# Patient Record
Sex: Male | Born: 1945 | Race: White | Hispanic: No | Marital: Single | Smoking: Never smoker
Health system: Southern US, Community
[De-identification: ages and names within clinical notes are randomized; demographics above are authoritative.]

## PROBLEM LIST (undated history)

## (undated) DIAGNOSIS — I509 Heart failure, unspecified: Secondary | ICD-10-CM

## (undated) DIAGNOSIS — I10 Essential (primary) hypertension: Secondary | ICD-10-CM

## (undated) HISTORY — DX: Heart failure, unspecified: I50.9

## (undated) HISTORY — DX: Essential (primary) hypertension: I10

---

## 2017-07-25 DIAGNOSIS — H35423 Microcystoid degeneration of retina, bilateral: Secondary | ICD-10-CM | POA: Diagnosis not present

## 2017-07-25 DIAGNOSIS — H524 Presbyopia: Secondary | ICD-10-CM | POA: Diagnosis not present

## 2020-06-11 ENCOUNTER — Other Ambulatory Visit: Payer: Self-pay

## 2020-06-11 ENCOUNTER — Inpatient Hospital Stay
Admission: EM | Admit: 2020-06-11 | Discharge: 2020-06-13 | DRG: 291 | Disposition: A | Discharge status: Home / Self Care | Payer: Medicare Other | Attending: Obstetrics and Gynecology | Admitting: Obstetrics and Gynecology

## 2020-06-11 ENCOUNTER — Emergency Department: Payer: Medicare Other

## 2020-06-11 ENCOUNTER — Other Ambulatory Visit
Admission: RE | Admit: 2020-06-11 | Discharge: 2020-06-11 | Disposition: A | Discharge status: Home / Self Care | Payer: Medicare Other | Source: Ambulatory Visit | Attending: Family Medicine | Admitting: Family Medicine

## 2020-06-11 ENCOUNTER — Encounter: Payer: Self-pay | Admitting: Emergency Medicine

## 2020-06-11 DIAGNOSIS — I493 Ventricular premature depolarization: Secondary | ICD-10-CM | POA: Diagnosis not present

## 2020-06-11 DIAGNOSIS — R5383 Other fatigue: Secondary | ICD-10-CM | POA: Diagnosis not present

## 2020-06-11 DIAGNOSIS — Z8249 Family history of ischemic heart disease and other diseases of the circulatory system: Secondary | ICD-10-CM

## 2020-06-11 DIAGNOSIS — I160 Hypertensive urgency: Secondary | ICD-10-CM | POA: Diagnosis not present

## 2020-06-11 DIAGNOSIS — H9190 Unspecified hearing loss, unspecified ear: Secondary | ICD-10-CM | POA: Diagnosis not present

## 2020-06-11 DIAGNOSIS — Z13220 Encounter for screening for lipoid disorders: Secondary | ICD-10-CM | POA: Diagnosis not present

## 2020-06-11 DIAGNOSIS — E669 Obesity, unspecified: Secondary | ICD-10-CM | POA: Diagnosis present

## 2020-06-11 DIAGNOSIS — Z03818 Encounter for observation for suspected exposure to other biological agents ruled out: Secondary | ICD-10-CM | POA: Diagnosis not present

## 2020-06-11 DIAGNOSIS — R0600 Dyspnea, unspecified: Secondary | ICD-10-CM | POA: Diagnosis not present

## 2020-06-11 DIAGNOSIS — I509 Heart failure, unspecified: Secondary | ICD-10-CM

## 2020-06-11 DIAGNOSIS — I5021 Acute systolic (congestive) heart failure: Secondary | ICD-10-CM

## 2020-06-11 DIAGNOSIS — R1084 Generalized abdominal pain: Secondary | ICD-10-CM | POA: Diagnosis not present

## 2020-06-11 DIAGNOSIS — Z6832 Body mass index (BMI) 32.0-32.9, adult: Secondary | ICD-10-CM | POA: Diagnosis not present

## 2020-06-11 DIAGNOSIS — M7989 Other specified soft tissue disorders: Secondary | ICD-10-CM | POA: Insufficient documentation

## 2020-06-11 DIAGNOSIS — R109 Unspecified abdominal pain: Secondary | ICD-10-CM | POA: Diagnosis not present

## 2020-06-11 DIAGNOSIS — J811 Chronic pulmonary edema: Secondary | ICD-10-CM | POA: Diagnosis not present

## 2020-06-11 DIAGNOSIS — R778 Other specified abnormalities of plasma proteins: Secondary | ICD-10-CM | POA: Diagnosis not present

## 2020-06-11 DIAGNOSIS — E038 Other specified hypothyroidism: Secondary | ICD-10-CM | POA: Diagnosis present

## 2020-06-11 DIAGNOSIS — Z23 Encounter for immunization: Secondary | ICD-10-CM | POA: Diagnosis not present

## 2020-06-11 DIAGNOSIS — I700 Atherosclerosis of aorta: Secondary | ICD-10-CM | POA: Diagnosis present

## 2020-06-11 DIAGNOSIS — I110 Hypertensive heart disease with heart failure: Secondary | ICD-10-CM | POA: Diagnosis not present

## 2020-06-11 DIAGNOSIS — I248 Other forms of acute ischemic heart disease: Secondary | ICD-10-CM | POA: Diagnosis present

## 2020-06-11 DIAGNOSIS — I5041 Acute combined systolic (congestive) and diastolic (congestive) heart failure: Secondary | ICD-10-CM | POA: Diagnosis not present

## 2020-06-11 DIAGNOSIS — Z20822 Contact with and (suspected) exposure to covid-19: Secondary | ICD-10-CM | POA: Diagnosis not present

## 2020-06-11 DIAGNOSIS — I499 Cardiac arrhythmia, unspecified: Secondary | ICD-10-CM | POA: Diagnosis not present

## 2020-06-11 DIAGNOSIS — I5031 Acute diastolic (congestive) heart failure: Secondary | ICD-10-CM

## 2020-06-11 DIAGNOSIS — Z974 Presence of external hearing-aid: Secondary | ICD-10-CM | POA: Diagnosis not present

## 2020-06-11 DIAGNOSIS — N179 Acute kidney failure, unspecified: Secondary | ICD-10-CM

## 2020-06-11 DIAGNOSIS — E8770 Fluid overload, unspecified: Secondary | ICD-10-CM | POA: Diagnosis not present

## 2020-06-11 DIAGNOSIS — I10 Essential (primary) hypertension: Secondary | ICD-10-CM

## 2020-06-11 DIAGNOSIS — R350 Frequency of micturition: Secondary | ICD-10-CM | POA: Diagnosis not present

## 2020-06-11 DIAGNOSIS — R030 Elevated blood-pressure reading, without diagnosis of hypertension: Secondary | ICD-10-CM | POA: Diagnosis not present

## 2020-06-11 DIAGNOSIS — I517 Cardiomegaly: Secondary | ICD-10-CM | POA: Diagnosis not present

## 2020-06-11 DIAGNOSIS — K219 Gastro-esophageal reflux disease without esophagitis: Secondary | ICD-10-CM | POA: Diagnosis not present

## 2020-06-11 DIAGNOSIS — R0602 Shortness of breath: Secondary | ICD-10-CM | POA: Diagnosis not present

## 2020-06-11 LAB — BASIC METABOLIC PANEL
Anion gap: 12 (ref 5–15)
BUN: 24 mg/dL — ABNORMAL HIGH (ref 8–23)
CO2: 25 mmol/L (ref 22–32)
Calcium: 9.4 mg/dL (ref 8.9–10.3)
Chloride: 107 mmol/L (ref 98–111)
Creatinine, Ser: 1.54 mg/dL — ABNORMAL HIGH (ref 0.61–1.24)
GFR, Estimated: 47 mL/min — ABNORMAL LOW (ref >60)
Glucose, Bld: 126 mg/dL — ABNORMAL HIGH (ref 70–99)
Potassium: 3.8 mmol/L (ref 3.5–5.1)
Sodium: 144 mmol/L (ref 135–145)

## 2020-06-11 LAB — BRAIN NATRIURETIC PEPTIDE: B Natriuretic Peptide: 1833.3 pg/mL — ABNORMAL HIGH (ref 0.0–100.0)

## 2020-06-11 LAB — HEPATIC FUNCTION PANEL
ALT: 34 U/L (ref 0–44)
AST: 32 U/L (ref 15–41)
Albumin: 4.4 g/dL (ref 3.5–5.0)
Alkaline Phosphatase: 72 U/L (ref 38–126)
Bilirubin, Direct: 0.3 mg/dL — ABNORMAL HIGH (ref 0.0–0.2)
Indirect Bilirubin: 1.4 mg/dL — ABNORMAL HIGH (ref 0.3–0.9)
Total Bilirubin: 1.7 mg/dL — ABNORMAL HIGH (ref 0.3–1.2)
Total Protein: 7.4 g/dL (ref 6.5–8.1)

## 2020-06-11 LAB — CBC
HCT: 47.3 % (ref 39.0–52.0)
Hemoglobin: 15.9 g/dL (ref 13.0–17.0)
MCH: 31.4 pg (ref 26.0–34.0)
MCHC: 33.6 g/dL (ref 30.0–36.0)
MCV: 93.5 fL (ref 80.0–100.0)
Platelets: 181 10*3/uL (ref 150–400)
RBC: 5.06 MIL/uL (ref 4.22–5.81)
RDW: 14 % (ref 11.5–15.5)
WBC: 8.8 10*3/uL (ref 4.0–10.5)
nRBC: 0 % (ref 0.0–0.2)

## 2020-06-11 LAB — TROPONIN I (HIGH SENSITIVITY)
Troponin I (High Sensitivity): 91 ng/L — ABNORMAL HIGH (ref <18)
Troponin I (High Sensitivity): 78 ng/L — ABNORMAL HIGH (ref <18)

## 2020-06-11 LAB — FIBRIN DERIVATIVES D-DIMER (ARMC ONLY): Fibrin derivatives D-dimer (ARMC): 808.76 ng/mL (FEU) — ABNORMAL HIGH (ref 0.00–499.00)

## 2020-06-11 LAB — SARS CORONAVIRUS 2 BY RT PCR (HOSPITAL ORDER, PERFORMED IN ~~LOC~~ HOSPITAL LAB): SARS Coronavirus 2: NEGATIVE

## 2020-06-11 MED ORDER — ONDANSETRON HCL 4 MG/2ML IJ SOLN: 4.0000 mg | Freq: Four times a day (QID) | INTRAMUSCULAR | Status: DC | PRN

## 2020-06-11 MED ORDER — ONDANSETRON HCL 4 MG PO TABS: 4.0000 mg | ORAL_TABLET | Freq: Four times a day (QID) | ORAL | Status: DC | PRN

## 2020-06-11 MED ORDER — FUROSEMIDE 10 MG/ML IJ SOLN
60.0000 mg | Freq: Once | INTRAMUSCULAR | Status: AC
  Administered 2020-06-11: 60 mg via INTRAVENOUS
  Filled 2020-06-11: qty 8

## 2020-06-11 MED ORDER — ACETAMINOPHEN 325 MG PO TABS: 650.0000 mg | ORAL_TABLET | Freq: Four times a day (QID) | ORAL | Status: DC | PRN

## 2020-06-11 MED ORDER — ACETAMINOPHEN 650 MG RE SUPP: 650.0000 mg | Freq: Four times a day (QID) | RECTAL | Status: DC | PRN

## 2020-06-11 MED ORDER — LABETALOL HCL 5 MG/ML IV SOLN
10.0000 mg | Freq: Once | INTRAVENOUS | Status: AC
  Administered 2020-06-11: 10 mg via INTRAVENOUS
  Filled 2020-06-11: qty 4

## 2020-06-11 MED ORDER — IOHEXOL 350 MG/ML SOLN
75.0000 mL | Freq: Once | INTRAVENOUS | Status: AC | PRN
  Administered 2020-06-11: 75 mL via INTRAVENOUS

## 2020-06-11 MED ORDER — FUROSEMIDE 10 MG/ML IJ SOLN
40.0000 mg | Freq: Two times a day (BID) | INTRAMUSCULAR | Status: DC
  Administered 2020-06-12: 40 mg via INTRAVENOUS
  Filled 2020-06-11: qty 4

## 2020-06-11 MED ORDER — ENOXAPARIN SODIUM 40 MG/0.4ML ~~LOC~~ SOLN
40.0000 mg | SUBCUTANEOUS | Status: DC
  Administered 2020-06-11 – 2020-06-12 (×2): 40 mg via SUBCUTANEOUS
  Filled 2020-06-11 (×2): qty 0.4

## 2020-06-11 MED ORDER — TRAZODONE HCL 50 MG PO TABS: 25.0000 mg | ORAL_TABLET | Freq: Every evening | ORAL | Status: DC | PRN

## 2020-06-11 MED ORDER — MAGNESIUM HYDROXIDE 400 MG/5ML PO SUSP: 30.0000 mL | Freq: Every day | ORAL | Status: DC | PRN

## 2020-06-11 NOTE — ED Notes (Signed)
Pt connected to cardiac monitor. NAD noted at this time.

## 2020-06-11 NOTE — ED Triage Notes (Signed)
Pt comes into the ED via POV from [facility] clinic where the patient had abnormal labs with a BNP over 1800 and a d-dimer over 800.  Pt denies any CP or dizziness but states every once in a while he has some SHOB.  Pt has even and unlabored respirations at this time and is in NAD. Pt states he does have pain in the left leg and it gets worse when he walks.  Pt denies any excess swelling at this time.

## 2020-06-11 NOTE — ED Provider Notes (Signed)
[facility] Emergency Department Provider Note  Time seen: 5:23 PM  I have reviewed the triage vital signs and the nursing notes.   HISTORY  Chief Complaint Abnormal Lab   HPI Douglas Mccall is a 76 y.o. male with no significant past medical history but does not follow-up with a primary care physician, presents to the emergency department for abnormal lab work.  According to the patient for the past week or 2 he has been experiencing what he describes as possible panic attacks.  States at night especially when he is lying on his side he will have a fluttering sensation sometimes in his abdomen or chest and feels somewhat short of breath.  Denies any chest pain at any point.  Does have mild swelling in his legs but denies any significant edema.  Denies any orthopnea.   Denies any fever or cough.  Patient was seen at the walk-in clinic today had lab work performed showing an elevated BNP of 1800 as well as an elevated D-dimer of 800 and was sent to the emergency department for further evaluation.  Patient is hypertensive currently 165/117.   History reviewed. No pertinent past medical history.  There are no problems to display for this patient.   History reviewed. No pertinent surgical history.  Prior to Admission medications   Not on File    No Known Allergies  History reviewed. No pertinent family history.  Social History Social History   Tobacco Use  . Smoking status: Never Smoker  . Smokeless tobacco: Never Used    Review of Systems Constitutional: Negative for fever. Cardiovascular: Negative for chest pain. Respiratory: Shortness of breath at times especially when lying down at night.  But does not describe clear orthopnea. Gastrointestinal: Negative for abdominal pain, vomiting and diarrhea. Musculoskeletal: Minimal leg swelling bilaterally Skin: Negative for skin complaints  Neurological: Negative for headache All other ROS  negative  ____________________________________________   PHYSICAL EXAM:  VITAL SIGNS: ED Triage Vitals  Enc Vitals Group     BP 06/11/20 1645 (!) 165/117     Pulse Rate 06/11/20 1645 93     Resp 06/11/20 1645 20     Temp 06/11/20 1645 98.1 F (36.7 C)     Temp Source 06/11/20 1645 Oral     SpO2 06/11/20 1645 94 %     Weight 06/11/20 1643 262 lb (118.8 kg)     Height 06/11/20 1643 6\' 1"  (1.854 m)     Head Circumference --      Peak Flow --      Pain Score 06/11/20 1643 0     Pain Loc --      Pain Edu? --      Excl. in GC? --    Constitutional: Alert and oriented. Well appearing and in no distress. Eyes: Normal exam ENT      Head: Normocephalic and atraumatic.      Mouth/Throat: Mucous membranes are moist. Cardiovascular: Normal rate, regular rhythm. Respiratory: Normal respiratory effort without tachypnea nor retractions. Breath sounds are clear Gastrointestinal: Soft and nontender. No distention. Musculoskeletal: Nontender with normal range of motion in all extremities.  Mild pedal edema bilaterally.  Calves are nontender. Neurologic:  Normal speech and language. No gross focal neurologic deficits Skin:  Skin is warm, dry and intact.  Psychiatric: Mood and affect are normal.   ____________________________________________    EKG  EKG viewed and interpreted by myself shows a normal sinus rhythm at 94 bpm with a borderline widened QRS,  normal axis, slight QTC prolongation otherwise normal intervals nonspecific ST changes but no ST elevation.  ____________________________________________    RADIOLOGY  Chest x-ray shows cardiac enlargement with vascular congestion. CT shows no PE but there is groundglass opacities consistent with atelectasis versus infectious etiology.  ____________________________________________   INITIAL IMPRESSION / ASSESSMENT AND PLAN / ED COURSE  Pertinent labs & imaging results that were available during my care of the patient were  reviewed by me and considered in my medical decision making (see chart for details).   Patient presents to the emergency department for intermittent episodes of shortness of breath, fluttering at times, abnormal lab work.  Given the patient's elevated D-dimer we will proceed with a CTA of the chest as well as a Covid test.  Patient does have mild pedal edema as well as an elevated BNP suspect a degree of CHF, possibly related to the patient's significant hypertension 165/117 which is currently untreated.  We will check labs including cardiac enzymes, CTA, continue to closely monitor while awaiting results.  Patient's labs are resulted showing elevated troponin of 91 as well as an elevated BNP performed as an outpatient of 1800 with imaging consistent with new onset CHF possibly hypertension dependent.  We will dose labetalol, IV Lasix and admit to the hospitalist service.  Covid test is pending.  Patient agreeable to plan.  Douglas Mccall was evaluated in Emergency Department on 06/11/2020 for the symptoms described in the history of present illness. He was evaluated in the context of the global COVID-19 pandemic, which necessitated consideration that the patient might be at risk for infection with the SARS-CoV-2 virus that causes COVID-19. Institutional protocols and algorithms that pertain to the evaluation of patients at risk for COVID-19 are in a state of rapid change based on information released by regulatory bodies including the CDC and federal and state organizations. These policies and algorithms were followed during the patient's care in the ED.  ____________________________________________   FINAL CLINICAL IMPRESSION(S) / ED DIAGNOSES  New onset CHF Hypertension Elevated troponin   Minna Antis, MD 06/11/20 1910

## 2020-06-11 NOTE — ED Triage Notes (Signed)
Pt to ED via POV, states was seen at [facility] earlier today and was called and told to come back to ED. Upon review of chart pt's BNP noted to be 1833 and D-Dimer 808.

## 2020-06-11 NOTE — H&P (Signed)
[facility]   PATIENT NAME: Douglas Mccall    MR#:  [id number]  DATE OF BIRTH:  [birth year]  DATE OF ADMISSION:  06/11/2020  PRIMARY CARE PHYSICIAN: Patient, No Pcp Per   REQUESTING/REFERRING PHYSICIAN: Minna Antis, MD  CHIEF COMPLAINT:   Chief Complaint  Patient presents with  . Abnormal Lab    HISTORY OF PRESENT ILLNESS:  Douglas Mccall  is a 75 y.o. Caucasian male with no known medical problems as he has not seen a physician since 1995, who presented to the emergency room with acute onset of recent dyspnea with associated orthopnea, paroxysmal nocturnal dyspnea and worsening lower extremity edema with dyspnea on exertion.  He admitted to decrease urinary stream.  He denies any fever or chills.  No cough or wheezing.  No chest pain or palpitations.  No dysuria or hematuria or flank pain.  He has been vaccinated and boosted for COVID-19.  Upon presentation to the ER, blood pressure was 165/1 6, blood pressure was 165/117 with otherwise normal vital signs.  Labs revealed a BUN of 20 4R and creatinine 1.54 with a direct bili of 1.4 and total bili 1.7 and CBC was unremarkable.  High-sensitivity troponin I was 91 and later 78 COVID-19 PCR came back negative.  Two-view chest x-ray showed cardiomegaly with pulmonary vascular congestion.  Chest CTA revealed no acute PE.  It showed mild bilateral groundglass densities that could be related to atelectasis or infectious etiology.  It showed aortic atherosclerosis. EKG showed sinus rhythm with rate of 94 with occasional PVCs and biventricular hypertrophy with prolonged QT interval with QTC 515 MS.  The patient was given 60 mg of IV Lasix and 10 mg of IV labetalol.  He will be admitted to a progressive unit bed for further evaluation and management. PAST MEDICAL HISTORY:  No known chronic medical problems  PAST SURGICAL HISTORY:  Tonsillectomy  SOCIAL HISTORY:   Social History   Tobacco Use  . Smoking status: Never Smoker  .  Smokeless tobacco: Never Used  Substance Use Topics  . Alcohol use: Not on file    FAMILY HISTORY:   Positive hypertension and coronary artery disease.  DRUG ALLERGIES:  No Known Allergies  REVIEW OF SYSTEMS:   ROS As per history of present illness. All pertinent systems were reviewed above. Constitutional, HEENT, cardiovascular, respiratory, GI, GU, musculoskeletal, neuro, psychiatric, endocrine, integumentary and hematologic systems were reviewed and are otherwise negative/unremarkable except for positive findings mentioned above in the HPI.   MEDICATIONS AT HOME:   Prior to Admission medications   Not on File      VITAL SIGNS:  Blood pressure (!) 177/117, pulse 91, temperature 98.1 F (36.7 C), temperature source Oral, resp. rate 20, height 6\' 1"  (1.854 m), weight 118.8 kg, SpO2 96 %.  PHYSICAL EXAMINATION:  Physical Exam  GENERAL:  75 y.o.-year-old obese Caucasian patient lying in the bed with mild respiratory distress with conversational dyspnea. EYES: Pupils equal, round, reactive to light and accommodation. No scleral icterus. Extraocular muscles intact.  HEENT: Head atraumatic, normocephalic. Oropharynx and nasopharynx clear.  NECK:  Supple, no jugular venous distention. No thyroid enlargement, no tenderness.  LUNGS: Diminished bibasal breath sounds with bibasal rales.   CARDIOVASCULAR: Regular rate and rhythm, S1, S2 normal. No murmurs, rubs, or gallops.  ABDOMEN: Soft, nondistended, nontender. Bowel sounds present. No organomegaly or mass.  EXTREMITIES: 1-2+ bilateral lower extremity pitting pedal edema, with no cyanosis, or clubbing.  NEUROLOGIC: Cranial nerves II through XII are  intact. Muscle strength 5/5 in all extremities. Sensation intact. Gait not checked.  PSYCHIATRIC: The patient is alert and oriented x 3.  Normal affect and good eye contact. SKIN: No obvious rash, lesion, or ulcer.   LABORATORY PANEL:   CBC Recent Labs  Lab 06/11/20 1647  WBC 8.8   HGB 15.9  HCT 47.3  PLT 181   ------------------------------------------------------------------------------------------------------------------  Chemistries  Recent Labs  Lab 06/11/20 1647  NA 144  K 3.8  CL 107  CO2 25  GLUCOSE 126*  BUN 24*  CREATININE 1.54*  CALCIUM 9.4  AST 32  ALT 34  ALKPHOS 72  BILITOT 1.7*   ------------------------------------------------------------------------------------------------------------------  Cardiac Enzymes No results for input(s): TROPONINI in the last 168 hours. ------------------------------------------------------------------------------------------------------------------  RADIOLOGY:  DG Chest 2 View  Result Date: 06/11/2020 CLINICAL DATA:  Shortness of breath.  Abnormal labs. EXAM: CHEST - 2 VIEW COMPARISON:  06/11/2020 FINDINGS: Mild cardiac enlargement. No pleural effusion or edema identified. Diffuse pulmonary vascular congestion. No airspace densities. Visualized osseous structures are notable for multilevel thoracic spondylosis. IMPRESSION: Cardiac enlargement and pulmonary vascular congestion. Electronically Signed   By: Signa Kell M.D.   On: 06/11/2020 17:54   CT Angio Chest PE W and/or Wo Contrast  Result Date: 06/11/2020 CLINICAL DATA:  Elevated D-dimer and shortness of breath EXAM: CT ANGIOGRAPHY CHEST WITH CONTRAST TECHNIQUE: Multidetector CT imaging of the chest was performed using the standard protocol during bolus administration of intravenous contrast. Multiplanar CT image reconstructions and MIPs were obtained to evaluate the vascular anatomy. CONTRAST:  84mL OMNIPAQUE IOHEXOL 350 MG/ML SOLN COMPARISON:  None. FINDINGS: Cardiovascular: There is a optimal opacification of the pulmonary arteries. There is no central,segmental, or subsegmental filling defects within the pulmonary arteries. There is moderate cardiomegaly present. No pericardial effusion or thickening. No evidence right heart strain. There is normal  three-vessel brachiocephalic anatomy without proximal stenosis. Scattered aortic atherosclerosis is seen. Mediastinum/Nodes: No hilar, mediastinal, or axillary adenopathy. Thyroid gland, trachea, and esophagus demonstrate no significant findings. Lungs/Pleura: Minimal ground-glass opacities are seen throughout both lungs. No pleural effusion or pneumothorax is seen. Upper Abdomen: No acute abnormalities present in the visualized portions of the upper abdomen. Musculoskeletal: No chest wall abnormality. No acute or significant osseous findings. Review of the MIP images confirms the above findings. IMPRESSION: No central, segmental, or subsegmental pulmonary embolism Minimal bilateral ground-glass opacities which could be due to atelectasis or infectious etiology. Aortic Atherosclerosis (ICD10-I70.0). Electronically Signed   By: Jonna Clark M.D.   On: 06/11/2020 18:40      IMPRESSION AND PLAN:   1.  Acute new onset CHF likely diastolic. The patient will be admitted to progressive unit bed. -We will diurese with IV Lasix. -We will follow serial troponin I's. -We will obtain a cardiology consultation and 2D echo. -I notified Dr. Welton Flakes about the patient.  2.  Hypertensive urgency.  This is likely the culprit for #1. -The patient will be placed on as needed IV labetalol. -We will start him on Norvasc.  3.  Acute kidney injury. -This could be prerenal due to acute CHF. -We will follow BMP with diuresis.  4.  Elevated troponin I. -This likely secondary to demand ischemia. -Troponin I has been trending down. -2D echo and a cardiology consult to be obtained as mentioned above.  5.  DVT prophylaxis. -Subcutaneous Lovenox.   All the records are reviewed and case discussed with ED provider. The plan of care was discussed in details with the patient (and family). I answered  all questions. The patient agreed to proceed with the above mentioned plan. Further management will depend upon hospital  course.   CODE STATUS: Full code  Status is: Inpatient  Remains inpatient appropriate because:Ongoing diagnostic testing needed not appropriate for outpatient work up, Unsafe d/c plan, IV treatments appropriate due to intensity of illness or inability to take PO and Inpatient level of care appropriate due to severity of illness   Dispo: The patient is from: Home              Anticipated d/c is to: Home              Anticipated d/c date is: 3 days              Patient currently is not medically stable to d/c.   Difficult to place patient No      TOTAL TIME TAKING CARE OF THIS PATIENT: 55 minutes.    Hannah Beat M.D on 06/11/2020 at 7:19 PM  [facility]   From 7 PM-7 AM, contact night-coverage [email or web address]  CC: Primary care physician; Patient, No Pcp Per

## 2020-06-11 NOTE — ED Notes (Signed)
Pt presents to ED with c/o of having a "shock wave" go through his body that has been happening for 3-4 weeks. Pt states this usually happens when he is laying down and pt also has c/o of SOB when this feeling happens. Pt states L foot has been "numb at times" and is more swollen than the R. L pedal pulse intact. Pt states he still rides his bicycle miles at a time for exercise. Pt is A&Ox4. Pt states he was sent from [facility] clinic due to abnormal labs. Pt was sent due to increased BNP and D-Dimer. Pt denies chest pain or any pain at this time.

## 2020-06-12 ENCOUNTER — Encounter: Payer: Self-pay | Admitting: Family Medicine

## 2020-06-12 DIAGNOSIS — I509 Heart failure, unspecified: Secondary | ICD-10-CM

## 2020-06-12 LAB — LIPID PANEL
Cholesterol: 172 mg/dL (ref 0–200)
HDL: 44 mg/dL (ref >40)
LDL Cholesterol: 116 mg/dL — ABNORMAL HIGH (ref 0–99)
Total CHOL/HDL Ratio: 3.9 RATIO
Triglycerides: 62 mg/dL (ref <150)
VLDL: 12 mg/dL (ref 0–40)

## 2020-06-12 LAB — HEPATIC FUNCTION PANEL
ALT: 31 U/L (ref 0–44)
AST: 31 U/L (ref 15–41)
Albumin: 4.2 g/dL (ref 3.5–5.0)
Alkaline Phosphatase: 69 U/L (ref 38–126)
Bilirubin, Direct: 0.3 mg/dL — ABNORMAL HIGH (ref 0.0–0.2)
Indirect Bilirubin: 1.6 mg/dL — ABNORMAL HIGH (ref 0.3–0.9)
Total Bilirubin: 1.9 mg/dL — ABNORMAL HIGH (ref 0.3–1.2)
Total Protein: 7 g/dL (ref 6.5–8.1)

## 2020-06-12 LAB — HIV ANTIBODY (ROUTINE TESTING W REFLEX): HIV Screen 4th Generation wRfx: NONREACTIVE

## 2020-06-12 LAB — CBC
HCT: 45.2 % (ref 39.0–52.0)
Hemoglobin: 15.4 g/dL (ref 13.0–17.0)
MCH: 31.6 pg (ref 26.0–34.0)
MCHC: 34.1 g/dL (ref 30.0–36.0)
MCV: 92.6 fL (ref 80.0–100.0)
Platelets: 160 10*3/uL (ref 150–400)
RBC: 4.88 MIL/uL (ref 4.22–5.81)
RDW: 13.9 % (ref 11.5–15.5)
WBC: 7.4 10*3/uL (ref 4.0–10.5)
nRBC: 0 % (ref 0.0–0.2)

## 2020-06-12 LAB — BASIC METABOLIC PANEL
Anion gap: 12 (ref 5–15)
BUN: 27 mg/dL — ABNORMAL HIGH (ref 8–23)
CO2: 26 mmol/L (ref 22–32)
Calcium: 9.1 mg/dL (ref 8.9–10.3)
Chloride: 104 mmol/L (ref 98–111)
Creatinine, Ser: 1.35 mg/dL — ABNORMAL HIGH (ref 0.61–1.24)
GFR, Estimated: 55 mL/min — ABNORMAL LOW (ref >60)
Glucose, Bld: 89 mg/dL (ref 70–99)
Potassium: 3.4 mmol/L — ABNORMAL LOW (ref 3.5–5.1)
Sodium: 142 mmol/L (ref 135–145)

## 2020-06-12 LAB — T4, FREE: Free T4: 0.79 ng/dL (ref 0.61–1.12)

## 2020-06-12 LAB — TSH: TSH: 12.434 u[IU]/mL — ABNORMAL HIGH (ref 0.350–4.500)

## 2020-06-12 LAB — HEMOGLOBIN A1C
Hgb A1c MFr Bld: 5.3 % (ref 4.8–5.6)
Mean Plasma Glucose: 105.41 mg/dL

## 2020-06-12 MED ORDER — FUROSEMIDE 10 MG/ML IJ SOLN
40.0000 mg | Freq: Two times a day (BID) | INTRAMUSCULAR | Status: AC
  Administered 2020-06-12: 40 mg via INTRAVENOUS
  Filled 2020-06-12: qty 4

## 2020-06-12 MED ORDER — FUROSEMIDE 20 MG PO TABS
20.0000 mg | ORAL_TABLET | Freq: Every day | ORAL | Status: DC
  Administered 2020-06-13: 20 mg via ORAL
  Filled 2020-06-12: qty 1

## 2020-06-12 NOTE — Plan of Care (Signed)
  Problem: Clinical Measurements: Goal: Will remain free from infection Outcome: Progressing   Problem: Clinical Measurements: Goal: Diagnostic test results will improve Outcome: Progressing   Problem: Clinical Measurements: Goal: Ability to maintain clinical measurements within normal limits will improve Outcome: Progressing   Problem: Health Behavior/Discharge Planning: Goal: Ability to manage health-related needs will improve Outcome: Progressing   Problem: Education: Goal: Knowledge of General Education information will improve Description: Including pain rating scale, medication(s)/side effects and non-pharmacologic comfort measures Outcome: Progressing

## 2020-06-12 NOTE — Consult Note (Addendum)
   Heart Failure Nurse Navigator Note  HF  Echocardiogram is pending at this time.   He presented to the emergency room with worsening PND and orthopnea.  Chest x-ray revealed cardiomegaly with pulmonary vascular congestion.  Blood pressure on admission was 177/117.  Comorbidities:  Hypertension   Medications:  Furosemide 40 mg IV every 12 hours   Labs:  Sodium 142, potassium 3.4, chloride 104, CO2 26, BUN 27 creatinine 1.35, GFR 55, total cholesterol 172, triglycerides 62, HDL 44, LDL 116, hemoglobin A1c 5.3, TSH 12.434, free T4 is pending Weight is 112 kg (weight is down 6.8 kg since admission) BMI is 32.59 Blood pressure 143/89 Intake 240 mL Output 2900 mL   Assessment:  General-he is awake and alert sitting up in the chair at bedside no acute distress.  HEENT-edentulous, no JVD.  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  Cardiac-heart tones of regular rate and rhythm.  Chest-breath sounds are clear to posterior auscultation.  Abdomen-soft nontender  Musculoskeletal-lower extremities there is no edema noted  Neurologic-he is awake and alert speech is clear.  Moves all extremities without difficulty.  Psych is pleasant and appropriate makes good eye contact.   Initial visit with patient.  States he has not been seen by a physician since 1995.  He states that he is fairly active and rides his bike at least 3 times a week.  He states the reason he had not seen a physician for so long was that he felt well, has nothing against doctors but his family just did not believe in going to the doctor if you felt well.  Discussed types of  heart failure with the patient. Instructed once echo is complete, will know more about his heart failure.  Began heart failure teaching with the patient.  Discussed the importance of weighing daily and recording, again with a low sodium diet and fluid restriction.  He viewed the heart failure videos. By teach back methold discussed  the video and what he had learned, he had caught that he should not be taking Advil.  Also given the heart failure teaching booklet along with his zone magnet, an appointment and information about the outpatient heart failure clinic.  He states that he does not have a scale at home to weigh himself.  Also requesting blood pressure cuff to monitor blood pressures at home. Going to contact [facility], to supply him with one.  Reds Vest  Clip reading was 28%.   Tresa Endo RN [facility]

## 2020-06-12 NOTE — Progress Notes (Signed)
Pt admitted from ED came from home with New onset CHF exacerbation with dyspnea on lying down which pt stated has significantly improved afte giving lasix IV. Pt awake alert and oriented x 4 mae no c/o of pain  Pt denied SOB at present.. Care plan and CHF education discussed with  Pt . And pt verbalized good understanding. . Cardiac monitor in use HR in 80 s SR with BBB . Oriented to room and use of the call light to call for assist when ever needed.  Vs stable. RN will continue to monitor pt.

## 2020-06-12 NOTE — Progress Notes (Addendum)
PROGRESS NOTE    Douglas Mccall  URK:[id number] DOB: 04/06/1946 DOA: 06/11/2020 PCP: Patient, No Pcp Per  Outpatient Specialists: none    Brief Narrative:   Douglas Mccall  is a 75 y.o. Caucasian male with no known medical problems as he has not seen a physician since 1995, who presented to the emergency room with acute onset of recent dyspnea with associated orthopnea, paroxysmal nocturnal dyspnea and worsening lower extremity edema with dyspnea on exertion.  He admitted to decrease urinary stream.  He denies any fever or chills.  No cough or wheezing.  No chest pain or palpitations.  No dysuria or hematuria or flank pain.  He has been vaccinated and boosted for COVID-19.  Upon presentation to the ER, blood pressure was 165/1 6, blood pressure was 165/117 with otherwise normal vital signs.  Labs revealed a BUN of 20 4R and creatinine 1.54 with a direct bili of 1.4 and total bili 1.7 and CBC was unremarkable.  High-sensitivity troponin I was 91 and later 78 COVID-19 PCR came back negative.  Two-view chest x-ray showed cardiomegaly with pulmonary vascular congestion.  Chest CTA revealed no acute PE.  It showed mild bilateral groundglass densities that could be related to atelectasis or infectious etiology.  It showed aortic atherosclerosis. EKG showed sinus rhythm with rate of 94 with occasional PVCs and biventricular hypertrophy with prolonged QT interval with QTC 515 MS.  The patient was given 60 mg of IV Lasix and 10 mg of IV labetalol.  He will be admitted to a progressive unit bed for further evaluation and management.   Assessment & Plan:   Active Problems:   Acute CHF (congestive heart failure) (HCC)  Acute new onset CHF  Demand ischemia Here w/ dyspnea, PND. CXR with vascular congestion. BNP markedly elevated. Troponins mildly elevated and flat (91>78), no CP or ischemic ekg findings. Has responded to IV lasix. Out 3 L yesterday. Does report history of rheumatic fever when young  -  cardiology consulted, recs pending.  - continue with lasix 40 iv bid for now - f/u echocardiogram - f/u a1c, tsh, lipids - possible d/c this afternoon of no concerning findings on TTE  Hypertensive urgency.  Likely 2/2 volume overload as has resolved w/ diuresis  Acute kidney injury. Vs ckd as no known baseline. Cr 1.5 on arrival, 1.35 this AM - trend   Elevated bilirubin Possibly congestive hepatopathy - repeat LFTs pending  Subclinical hypothyroidism Elevated TSH, free t4 wnl - advise outpt repeat TFTs  Health care maintenance - f/u hiv, hcv   DVT prophylaxis: lovenox Code Status: full Family Communication: none @ bedside  Level of care: Progressive Cardiac Status is: Inpatient  Remains inpatient appropriate because:Inpatient level of care appropriate due to severity of illness   Dispo: The patient is from: Home              Anticipated d/c is to: Home              Anticipated d/c date is: 1 day              Patient currently is not medically stable to d/c.   Difficult to place patient No        Consultants:  cardiology  Procedures: none  Antimicrobials:  none    Subjective: This morning PND improved, no chest pain or sob  Objective: Vitals:   06/11/20 2215 06/11/20 2331 06/12/20 0413 06/12/20 0759  BP: (!) 143/84 (!) 135/110 123/81 125/88  Pulse: 62 78  68 (!) 58  Resp: 18 19 18 18   Temp: 98.6 F (37 C)  97.8 F (36.6 C) 98 F (36.7 C)  TempSrc: Oral     SpO2: 97% 98% 94% 95%  Weight:   112 kg   Height:        Intake/Output Summary (Last 24 hours) at 06/12/2020 0905 Last data filed at 06/12/2020 0640 Gross per 24 hour  Intake 240 ml  Output 2900 ml  Net -2660 ml   Filed Weights   06/11/20 1643 06/12/20 0413  Weight: 118.8 kg 112 kg    Examination:  General exam: Appears calm and comfortable  Respiratory system: Clear to auscultation. Respiratory effort normal. Cardiovascular system: S1 & S2 heard, RRR. No JVD, murmurs, rubs,  gallops or clicks. No pedal edema. Gastrointestinal system: Abdomen is obese, nondistended, soft and nontender. No organomegaly or masses felt. Normal bowel sounds heard. Central nervous system: Alert and oriented. No focal neurological deficits. Extremities: Symmetric 5 x 5 power. 1 + LE pitting edema Skin: No rashes, lesions or ulcers Psychiatry: Judgement and insight appear normal. Mood & affect appropriate.     Data Reviewed: I have personally reviewed following labs and imaging studies  CBC: Recent Labs  Lab 06/11/20 1647 06/12/20 0418  WBC 8.8 7.4  HGB 15.9 15.4  HCT 47.3 45.2  MCV 93.5 92.6  PLT 181 160   Basic Metabolic Panel: Recent Labs  Lab 06/11/20 1647 06/12/20 0418  NA 144 142  K 3.8 3.4*  CL 107 104  CO2 25 26  GLUCOSE 126* 89  BUN 24* 27*  CREATININE 1.54* 1.35*  CALCIUM 9.4 9.1   GFR: Estimated Creatinine Clearance: 63.9 mL/min (A) (by C-G formula based on SCr of 1.35 mg/dL (H)). Liver Function Tests: Recent Labs  Lab 06/11/20 1647  AST 32  ALT 34  ALKPHOS 72  BILITOT 1.7*  PROT 7.4  ALBUMIN 4.4   No results for input(s): LIPASE, AMYLASE in the last 168 hours. No results for input(s): AMMONIA in the last 168 hours. Coagulation Profile: No results for input(s): INR, PROTIME in the last 168 hours. Cardiac Enzymes: No results for input(s): CKTOTAL, CKMB, CKMBINDEX, TROPONINI in the last 168 hours. BNP (last 3 results) No results for input(s): PROBNP in the last 8760 hours. HbA1C: No results for input(s): HGBA1C in the last 72 hours. CBG: No results for input(s): GLUCAP in the last 168 hours. Lipid Profile: No results for input(s): CHOL, HDL, LDLCALC, TRIG, CHOLHDL, LDLDIRECT in the last 72 hours. Thyroid Function Tests: No results for input(s): TSH, T4TOTAL, FREET4, T3FREE, THYROIDAB in the last 72 hours. Anemia Panel: No results for input(s): VITAMINB12, FOLATE, FERRITIN, TIBC, IRON, RETICCTPCT in the last 72 hours. Urine analysis: No  results found for: COLORURINE, APPEARANCEUR, LABSPEC, PHURINE, GLUCOSEU, HGBUR, BILIRUBINUR, KETONESUR, PROTEINUR, UROBILINOGEN, NITRITE, LEUKOCYTESUR Sepsis Labs: @LABRCNTIP (procalcitonin:4,lacticidven:4)  ) Recent Results (from the past 240 hour(s))  SARS Coronavirus 2 by RT PCR (hospital order, performed in [facility] hospital lab) Nasopharyngeal Nasopharyngeal Swab     Status: None   Collection Time: 06/11/20  6:46 PM   Specimen: Nasopharyngeal Swab  Result Value Ref Range Status   SARS Coronavirus 2 NEGATIVE NEGATIVE Final    Comment: (NOTE) SARS-CoV-2 target nucleic acids are NOT DETECTED.  The SARS-CoV-2 RNA is generally detectable in upper and lower respiratory specimens during the acute phase of infection. The lowest concentration of SARS-CoV-2 viral copies this assay can detect is 250 copies / mL. A negative result does not preclude  SARS-CoV-2 infection and should not be used as the sole basis for treatment or other patient management decisions.  A negative result may occur with improper specimen collection / handling, submission of specimen other than nasopharyngeal swab, presence of viral mutation(s) within the areas targeted by this assay, and inadequate number of viral copies (<250 copies / mL). A negative result must be combined with clinical observations, patient history, and epidemiological information.  Fact Sheet for Patients:   BoilerBrush.com.cy  Fact Sheet for Healthcare Providers: https://pope.com/  This test is not yet approved or  cleared by the [place] FDA and has been authorized for detection and/or diagnosis of SARS-CoV-2 by FDA under an Emergency Use Authorization (EUA).  This EUA will remain in effect (meaning this test can be used) for the duration of the COVID-19 declaration under Section 564(b)(1) of the Act, 21 U.S.C. section 360bbb-3(b)(1), unless the authorization is terminated or revoked  sooner.  Performed at [facility], [address], [place], [place] [address]          Radiology Studies: DG Chest 2 View  Result Date: 06/11/2020 CLINICAL DATA:  Shortness of breath.  Abnormal labs. EXAM: CHEST - 2 VIEW COMPARISON:  06/11/2020 FINDINGS: Mild cardiac enlargement. No pleural effusion or edema identified. Diffuse pulmonary vascular congestion. No airspace densities. Visualized osseous structures are notable for multilevel thoracic spondylosis. IMPRESSION: Cardiac enlargement and pulmonary vascular congestion. Electronically Signed   By: Signa Kell M.D.   On: 06/11/2020 17:54   CT Angio Chest PE W and/or Wo Contrast  Result Date: 06/11/2020 CLINICAL DATA:  Elevated D-dimer and shortness of breath EXAM: CT ANGIOGRAPHY CHEST WITH CONTRAST TECHNIQUE: Multidetector CT imaging of the chest was performed using the standard protocol during bolus administration of intravenous contrast. Multiplanar CT image reconstructions and MIPs were obtained to evaluate the vascular anatomy. CONTRAST:  87mL OMNIPAQUE IOHEXOL 350 MG/ML SOLN COMPARISON:  None. FINDINGS: Cardiovascular: There is a optimal opacification of the pulmonary arteries. There is no central,segmental, or subsegmental filling defects within the pulmonary arteries. There is moderate cardiomegaly present. No pericardial effusion or thickening. No evidence right heart strain. There is normal three-vessel brachiocephalic anatomy without proximal stenosis. Scattered aortic atherosclerosis is seen. Mediastinum/Nodes: No hilar, mediastinal, or axillary adenopathy. Thyroid gland, trachea, and esophagus demonstrate no significant findings. Lungs/Pleura: Minimal ground-glass opacities are seen throughout both lungs. No pleural effusion or pneumothorax is seen. Upper Abdomen: No acute abnormalities present in the visualized portions of the upper abdomen. Musculoskeletal: No chest wall abnormality. No acute or significant osseous  findings. Review of the MIP images confirms the above findings. IMPRESSION: No central, segmental, or subsegmental pulmonary embolism Minimal bilateral ground-glass opacities which could be due to atelectasis or infectious etiology. Aortic Atherosclerosis (ICD10-I70.0). Electronically Signed   By: Jonna Clark M.D.   On: 06/11/2020 18:40        Scheduled Meds: . enoxaparin (LOVENOX) injection  40 mg Subcutaneous Q24H  . furosemide  40 mg Intravenous Q12H   Continuous Infusions:   LOS: 1 day    Time spent: 45 min    Silvano Bilis, MD [facility]   If 7PM-7AM, please contact night-coverage [email or web address] Password [email or web address] 06/12/2020, 9:05 AM

## 2020-06-12 NOTE — Consult Note (Signed)
Douglas Mccall is a 75 y.o. male  [id number]  Primary Cardiologist: Adrian Blackwater Reason for Consultation: Possible HFpEF  HPI: Patient is a 75 year old male with no known medical history, not seen by medical provider for over 20 years, presented to the emergency department with worsening dyspnea, orthopnea, and PND.  Patient was was negative for PE but chest x-ray revealed cardiomegaly and pulmonary vascular congestion.  We have been consulted for possible new onset HFpEF.   Patient does describe extensive family cardiac history especially with his mother requiring cardiac bypass surgery.  Review of Systems: Patient currently denies dyspnea, orthopnea, or PND.  Patient also denies any chest pain.   History reviewed. No pertinent past medical history.  Medications Prior to Admission  Medication Sig Dispense Refill  . hydrochlorothiazide (HYDRODIURIL) 25 MG tablet Take 25 mg by mouth daily.       Marland Kitchen enoxaparin (LOVENOX) injection  40 mg Subcutaneous Q24H  . furosemide  40 mg Intravenous Q12H    Infusions:   Not on File  Social History   Socioeconomic History  . Marital status: Single    Spouse name: Not on file  . Number of children: Not on file  . Years of education: Not on file  . Highest education level: Not on file  Occupational History  . Not on file  Tobacco Use  . Smoking status: Never Smoker  . Smokeless tobacco: Never Used  Substance and Sexual Activity  . Alcohol use: Not on file  . Drug use: Not on file  . Sexual activity: Not on file  Other Topics Concern  . Not on file  Social History Narrative  . Not on file   Social Determinants of Health   Financial Resource Strain: Not on file  Food Insecurity: Not on file  Transportation Needs: Not on file  Physical Activity: Not on file  Stress: Not on file  Social Connections: Not on file  Intimate Partner Violence: Not on file    History reviewed. No pertinent family history.  PHYSICAL  EXAM: Vitals:   06/12/20 0413 06/12/20 0759  BP: 123/81 125/88  Pulse: 68 (!) 58  Resp: 18 18  Temp: 97.8 F (36.6 C) 98 F (36.7 C)  SpO2: 94% 95%     Intake/Output Summary (Last 24 hours) at 06/12/2020 1051 Last data filed at 06/12/2020 0947 Gross per 24 hour  Intake 600 ml  Output 4000 ml  Net -3400 ml    General:  Well appearing. No respiratory difficulty HEENT: normal Neck: supple. no JVD. Carotids 2+ bilat; no bruits. No lymphadenopathy or thryomegaly appreciated. Cor: PMI nondisplaced. Regular rate & rhythm. No rubs, gallops or murmurs. Lungs: clear Abdomen: soft, nontender, nondistended. No hepatosplenomegaly. No bruits or masses. Good bowel sounds. Extremities: no cyanosis, clubbing, rash, edema Neuro: alert & oriented x 3, cranial nerves grossly intact. moves all 4 extremities w/o difficulty. Affect pleasant.  ECG: NSR with PVC. Biventricular hypertrophy. Evidence of previous inferior and lateral ischemia. 94/bpm  Results for orders placed or performed during the hospital encounter of 06/11/20 (from the past 24 hour(s))  Basic metabolic panel     Status: Abnormal   Collection Time: 06/11/20  4:47 PM  Result Value Ref Range   Sodium 144 135 - 145 mmol/L   Potassium 3.8 3.5 - 5.1 mmol/L   Chloride 107 98 - 111 mmol/L   CO2 25 22 - 32 mmol/L   Glucose, Bld 126 (H) 70 - 99 mg/dL   BUN 24 (H)  8 - 23 mg/dL   Creatinine, Ser 8.67 (H) 0.61 - 1.24 mg/dL   Calcium 9.4 8.9 - 61.9 mg/dL   GFR, Estimated 47 (L) >60 mL/min   Anion gap 12 5 - 15  CBC     Status: None   Collection Time: 06/11/20  4:47 PM  Result Value Ref Range   WBC 8.8 4.0 - 10.5 K/uL   RBC 5.06 4.22 - 5.81 MIL/uL   Hemoglobin 15.9 13.0 - 17.0 g/dL   HCT 50.9 32.6 - 71.2 %   MCV 93.5 80.0 - 100.0 fL   MCH 31.4 26.0 - 34.0 pg   MCHC 33.6 30.0 - 36.0 g/dL   RDW 45.8 09.9 - 83.3 %   Platelets 181 150 - 400 K/uL   nRBC 0.0 0.0 - 0.2 %  Troponin I (High Sensitivity)     Status: Abnormal   Collection  Time: 06/11/20  4:47 PM  Result Value Ref Range   Troponin I (High Sensitivity) 91 (H) <18 ng/L  Hepatic function panel     Status: Abnormal   Collection Time: 06/11/20  4:47 PM  Result Value Ref Range   Total Protein 7.4 6.5 - 8.1 g/dL   Albumin 4.4 3.5 - 5.0 g/dL   AST 32 15 - 41 U/L   ALT 34 0 - 44 U/L   Alkaline Phosphatase 72 38 - 126 U/L   Total Bilirubin 1.7 (H) 0.3 - 1.2 mg/dL   Bilirubin, Direct 0.3 (H) 0.0 - 0.2 mg/dL   Indirect Bilirubin 1.4 (H) 0.3 - 0.9 mg/dL  SARS Coronavirus 2 by RT PCR (hospital order, performed in [facility] hospital lab) Nasopharyngeal Nasopharyngeal Swab     Status: None   Collection Time: 06/11/20  6:46 PM   Specimen: Nasopharyngeal Swab  Result Value Ref Range   SARS Coronavirus 2 NEGATIVE NEGATIVE  Troponin I (High Sensitivity)     Status: Abnormal   Collection Time: 06/11/20  6:46 PM  Result Value Ref Range   Troponin I (High Sensitivity) 78 (H) <18 ng/L  Basic metabolic panel     Status: Abnormal   Collection Time: 06/12/20  4:18 AM  Result Value Ref Range   Sodium 142 135 - 145 mmol/L   Potassium 3.4 (L) 3.5 - 5.1 mmol/L   Chloride 104 98 - 111 mmol/L   CO2 26 22 - 32 mmol/L   Glucose, Bld 89 70 - 99 mg/dL   BUN 27 (H) 8 - 23 mg/dL   Creatinine, Ser 8.25 (H) 0.61 - 1.24 mg/dL   Calcium 9.1 8.9 - 05.3 mg/dL   GFR, Estimated 55 (L) >60 mL/min   Anion gap 12 5 - 15  CBC     Status: None   Collection Time: 06/12/20  4:18 AM  Result Value Ref Range   WBC 7.4 4.0 - 10.5 K/uL   RBC 4.88 4.22 - 5.81 MIL/uL   Hemoglobin 15.4 13.0 - 17.0 g/dL   HCT 97.6 73.4 - 19.3 %   MCV 92.6 80.0 - 100.0 fL   MCH 31.6 26.0 - 34.0 pg   MCHC 34.1 30.0 - 36.0 g/dL   RDW 79.0 24.0 - 97.3 %   Platelets 160 150 - 400 K/uL   nRBC 0.0 0.0 - 0.2 %  Hepatic function panel     Status: Abnormal   Collection Time: 06/12/20  4:18 AM  Result Value Ref Range   Total Protein 7.0 6.5 - 8.1 g/dL   Albumin 4.2 3.5 - 5.0  g/dL   AST 31 15 - 41 U/L   ALT 31 0 - 44  U/L   Alkaline Phosphatase 69 38 - 126 U/L   Total Bilirubin 1.9 (H) 0.3 - 1.2 mg/dL   Bilirubin, Direct 0.3 (H) 0.0 - 0.2 mg/dL   Indirect Bilirubin 1.6 (H) 0.3 - 0.9 mg/dL  TSH     Status: Abnormal   Collection Time: 06/12/20  9:41 AM  Result Value Ref Range   TSH 12.434 (H) 0.350 - 4.500 uIU/mL  Lipid panel     Status: Abnormal   Collection Time: 06/12/20  9:41 AM  Result Value Ref Range   Cholesterol 172 0 - 200 mg/dL   Triglycerides 62 <580 mg/dL   HDL 44 >99 mg/dL   Total CHOL/HDL Ratio 3.9 RATIO   VLDL 12 0 - 40 mg/dL   LDL Cholesterol 833 (H) 0 - 99 mg/dL   DG Chest 2 View  Result Date: 06/11/2020 CLINICAL DATA:  Shortness of breath.  Abnormal labs. EXAM: CHEST - 2 VIEW COMPARISON:  06/11/2020 FINDINGS: Mild cardiac enlargement. No pleural effusion or edema identified. Diffuse pulmonary vascular congestion. No airspace densities. Visualized osseous structures are notable for multilevel thoracic spondylosis. IMPRESSION: Cardiac enlargement and pulmonary vascular congestion. Electronically Signed   By: Signa Kell M.D.   On: 06/11/2020 17:54   CT Angio Chest PE W and/or Wo Contrast  Result Date: 06/11/2020 CLINICAL DATA:  Elevated D-dimer and shortness of breath EXAM: CT ANGIOGRAPHY CHEST WITH CONTRAST TECHNIQUE: Multidetector CT imaging of the chest was performed using the standard protocol during bolus administration of intravenous contrast. Multiplanar CT image reconstructions and MIPs were obtained to evaluate the vascular anatomy. CONTRAST:  19mL OMNIPAQUE IOHEXOL 350 MG/ML SOLN COMPARISON:  None. FINDINGS: Cardiovascular: There is a optimal opacification of the pulmonary arteries. There is no central,segmental, or subsegmental filling defects within the pulmonary arteries. There is moderate cardiomegaly present. No pericardial effusion or thickening. No evidence right heart strain. There is normal three-vessel brachiocephalic anatomy without proximal stenosis. Scattered aortic  atherosclerosis is seen. Mediastinum/Nodes: No hilar, mediastinal, or axillary adenopathy. Thyroid gland, trachea, and esophagus demonstrate no significant findings. Lungs/Pleura: Minimal ground-glass opacities are seen throughout both lungs. No pleural effusion or pneumothorax is seen. Upper Abdomen: No acute abnormalities present in the visualized portions of the upper abdomen. Musculoskeletal: No chest wall abnormality. No acute or significant osseous findings. Review of the MIP images confirms the above findings. IMPRESSION: No central, segmental, or subsegmental pulmonary embolism Minimal bilateral ground-glass opacities which could be due to atelectasis or infectious etiology. Aortic Atherosclerosis (ICD10-I70.0). Electronically Signed   By: Jonna Clark M.D.   On: 06/11/2020 18:40     ASSESSMENT AND PLAN: Patient presented to the emergency department with worsening dyspnea, orthopnea, and PND.  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  Commend continuing furosemide 40 mg IV this morning with consideration of transitioning to oral this afternoon pending echocardiogram.  Patient appears euvolemic and his weight is down 15 pounds from initial admission.  Patient currently has no respiratory distress and has stable vital signs, if echocardiogram is without significant structural abnormalities patient can most likely be discharged and have further cardiac work-up as an outpatient.  Maryelizabeth Kaufmann NP-C

## 2020-06-13 ENCOUNTER — Inpatient Hospital Stay
Admit: 2020-06-13 | Discharge: 2020-06-13 | Disposition: A | Discharge status: Home / Self Care | Payer: Medicare Other | Attending: Obstetrics and Gynecology | Admitting: Obstetrics and Gynecology

## 2020-06-13 DIAGNOSIS — I5021 Acute systolic (congestive) heart failure: Secondary | ICD-10-CM

## 2020-06-13 LAB — HEPATIC FUNCTION PANEL
ALT: 30 U/L (ref 0–44)
AST: 33 U/L (ref 15–41)
Albumin: 4.2 g/dL (ref 3.5–5.0)
Alkaline Phosphatase: 71 U/L (ref 38–126)
Bilirubin, Direct: 0.3 mg/dL — ABNORMAL HIGH (ref 0.0–0.2)
Indirect Bilirubin: 1.5 mg/dL — ABNORMAL HIGH (ref 0.3–0.9)
Total Bilirubin: 1.8 mg/dL — ABNORMAL HIGH (ref 0.3–1.2)
Total Protein: 7.1 g/dL (ref 6.5–8.1)

## 2020-06-13 LAB — ECHOCARDIOGRAM COMPLETE
AR max vel: 1.82 cm2
AV Area VTI: 1.76 cm2
AV Area mean vel: 1.89 cm2
AV Mean grad: 3 mmHg
AV Peak grad: 4.8 mmHg
Ao pk vel: 1.1 m/s
Area-P 1/2: 3.54 cm2
Calc EF: 32.7 %
Height: 73 in
S' Lateral: 5.82 cm
Single Plane A2C EF: 33.2 %
Single Plane A4C EF: 29.2 %
Weight: 3872 oz

## 2020-06-13 LAB — BASIC METABOLIC PANEL
Anion gap: 13 (ref 5–15)
BUN: 33 mg/dL — ABNORMAL HIGH (ref 8–23)
CO2: 27 mmol/L (ref 22–32)
Calcium: 9.1 mg/dL (ref 8.9–10.3)
Chloride: 101 mmol/L (ref 98–111)
Creatinine, Ser: 1.53 mg/dL — ABNORMAL HIGH (ref 0.61–1.24)
GFR, Estimated: 48 mL/min — ABNORMAL LOW (ref >60)
Glucose, Bld: 98 mg/dL (ref 70–99)
Potassium: 3.3 mmol/L — ABNORMAL LOW (ref 3.5–5.1)
Sodium: 141 mmol/L (ref 135–145)

## 2020-06-13 LAB — HCV INTERPRETATION

## 2020-06-13 LAB — HCV AB W REFLEX TO QUANT PCR: HCV Ab: 0.1 s/co ratio (ref 0.0–0.9)

## 2020-06-13 MED ORDER — FUROSEMIDE 20 MG PO TABS: 20.0000 mg | ORAL_TABLET | Freq: Every day | ORAL | 2 refills | Status: AC

## 2020-06-13 MED ORDER — POTASSIUM CHLORIDE CRYS ER 20 MEQ PO TBCR
40.0000 meq | EXTENDED_RELEASE_TABLET | Freq: Once | ORAL | Status: AC
  Administered 2020-06-13: 40 meq via ORAL
  Filled 2020-06-13 (×2): qty 2

## 2020-06-13 MED ORDER — SACUBITRIL-VALSARTAN 24-26 MG PO TABS
1.0000 | ORAL_TABLET | Freq: Two times a day (BID) | ORAL | Status: DC
  Administered 2020-06-13: 1 via ORAL
  Filled 2020-06-13: qty 1

## 2020-06-13 MED ORDER — SACUBITRIL-VALSARTAN 24-26 MG PO TABS: 1.0000 | ORAL_TABLET | Freq: Two times a day (BID) | ORAL | 2 refills | Status: DC

## 2020-06-13 MED ORDER — POTASSIUM CHLORIDE CRYS ER 20 MEQ PO TBCR: 20.0000 meq | EXTENDED_RELEASE_TABLET | Freq: Every day | ORAL | 2 refills | Status: AC

## 2020-06-13 NOTE — Progress Notes (Signed)
*  PRELIMINARY RESULTS* Echocardiogram 2D Echocardiogram has been performed.  Cristela Blue 06/13/2020, 10:56 AM

## 2020-06-13 NOTE — Progress Notes (Signed)
  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  Left ventricle demonstrates global hypokinesis.  Left ventricle is severely dilated.  Moderate concentric LVH.  Grade 3 diastolic dysfunction.  Right ventricular systolic function is normal.  The atria is severely dilated.  Right atria is mildly dilated.  Mild mitral regurgitation    He presented to the emergency room with worsening PND and orthopnea.  X-ray revealed cardiomegaly with pulmonary vascular congestion.  Pressure on admission was 177/117.  Comorbidities:  Hypertension   Medications:  Lasix 20 mg daily Entresto 24/26 mg 1 tablet 2 times a day Potassium chloride 20 mEq daily   Labs:  Sodium 141, potassium 3.3, chloride 101, CO2 27, BUN 33, creatinine 1.53 Intake 1320 mL Output 2100 mL Weight is 109.8 kg BMI is 31.9 Blood pressure 114/69   Assessment:  General-he is awake and alert sitting on the edge of the bed, awaiting his echocardiogram.  HEENT-edentulous, sclera nonicteric.  Cardiac-heart tones of regular rate and rhythm.  Chest-breath sounds are clear to posterior auscultation.  Abdomen-rounded distended nontender.  Muscle skeletal-no lower extremity edema  Psych-is very pleasant makes good eye contact.  Neurologic-speech is clear.  Moves all extremities without difficulty.   Met with patient today to discuss heart failure.  He had written questions pertaining mainly to diet, foods to eat and foods to avoid.  This was discussed at great length, especially the foods to avoid.  Also discussed fluid restriction but he feels he does not drink eight 8 ounce glasses in a days time.  He was given heart failure diet cookbook along with dietary handout on low sodium.  He was given a scale, and discussed daily weights first thing in the morning after he emptying his bladder and then to record.  Was also given a blood pressure machine, discussed sitting and resting 10 to 15 minutes before checking his  blood pressure and then to record.  Recommended taking the readings for weight and blood pressure when he goes to his physician's office.  He is to be discharged home and will follow up in the heart failure clinic on February 21 at 10 AM.

## 2020-06-13 NOTE — Discharge Instructions (Signed)
Heart Failure, Diagnosis  Heart failure means that your heart is not able to pump blood in the right way. This makes it hard for your body to work well. Heart failure is usually a long-term (chronic) condition. You must take good care of yourself and follow your treatment plan from your doctor. What are the causes?  High blood pressure.  Buildup of cholesterol and fat in the arteries.  Heart attack. This injures the heart muscle.  Heart valves that do not open and close properly.  Damage of the heart muscle. This is also called cardiomyopathy.  Infection of the heart muscle. This is also called myocarditis.  Lung disease. What increases the risk?  Getting older. The risk of heart failure goes up as a person ages.  Being overweight.  Being male.  Use tobacco or nicotine products.  Abusing alcohol or drugs.  Having taken medicines that can damage the heart.  Having any of these conditions: ? Diabetes. ? Abnormal heart rhythms. ? Thyroid problems. ? Low blood counts (anemia).  Having a family history of heart failure. What are the signs or symptoms?  Shortness of breath.  Coughing.  Swelling of the feet, ankles, legs, or belly.  Losing or gaining weight for no reason.  Trouble breathing.  Waking from sleep because of the need to sit up and get more air.  Fast heartbeat.  Being very tired.  Feeling dizzy, or feeling like you may pass out (faint).  Having no desire to eat.  Feeling like you may vomit (nauseous).  Peeing (urinating) more at night.  Feeling confused. How is this treated? This condition may be treated with:  Medicines. These can be given to treat blood pressure and to make the heart muscles stronger.  Changes in your daily life. These may include: ? Eating a healthy diet. ? Staying at a healthy body weight. ? Quitting tobacco, alcohol, and drug use. ? Doing exercises. ? Participating in a cardiac rehabilitation program. This program  helps you improve your health through exercise, education, and counseling.  Surgery. Surgery can be done to open blocked valves, or to put devices in the heart, such as pacemakers.  A donor heart (heart transplant). You will receive a healthy heart from a donor. Follow these instructions at home:  Treat other conditions as told by your doctor. These may include high blood pressure, diabetes, thyroid disease, or abnormal heart rhythms.  Learn as much as you can about heart failure.  Get support as you need it.  Keep all follow-up visits. Summary  Heart failure means that your heart is not able to pump blood in the right way.  This condition is often caused by high blood pressure, heart attack, or damage of the heart muscle.  Symptoms of this condition include shortness of breath and swelling of the feet, ankles, legs, or belly. You may also feel very tired or feel like you may vomit.  You may be treated with medicines, surgery, or changes in your daily life.  Treat other health conditions as told by your doctor. This information is not intended to replace advice given to you by your health care provider. Make sure you discuss any questions you have with your health care provider. Document Revised: 11/17/2019 Document Reviewed: 11/17/2019 Elsevier Patient Education  2021 Elsevier Inc.  

## 2020-06-13 NOTE — Discharge Summary (Signed)
Douglas Mccall HBZ:[id number] DOB: 1946/01/09 DOA: 06/11/2020  PCP: Patient, No Pcp Per  Admit date: 06/11/2020 Discharge date: 06/13/2020  Time spent: 35  minutes  Recommendations for Outpatient Follow-up:  1. Has f/u scheduled in heart failure clinic where bmp will need to be checked. Will also need to f/u with cardiology and establish with a PCP  2. Repeat LFTs at some point (elevated bili) 3. F/u subclinical hypothyroidism with PCP    Discharge Diagnoses:  Active Problems:   Acute CHF (congestive heart failure) (HCC)   Discharge Condition: stable  Diet recommendation: low sodium  Filed Weights   06/11/20 1643 06/12/20 0413 06/13/20 0400  Weight: 118.8 kg 112 kg (P) 109.8 kg    History of present illness:  Douglas Mccall  is a 75 y.o. Caucasian male with no known medical problems as he has not seen a physician since 1995, who presented to the emergency room with acute onset of recent dyspnea with associated orthopnea, paroxysmal nocturnal dyspnea and worsening lower extremity edema with dyspnea on exertion.  He admitted to decrease urinary stream.  He denies any fever or chills.  No cough or wheezing.  No chest pain or palpitations.  No dysuria or hematuria or flank pain.  He has been vaccinated and boosted for COVID-19.  Upon presentation to the ER, blood pressure was 165/1 6, blood pressure was 165/117 with otherwise normal vital signs.  Labs revealed a BUN of 20 4R and creatinine 1.54 with a direct bili of 1.4 and total bili 1.7 and CBC was unremarkable.  High-sensitivity troponin I was 91 and later 78 COVID-19 PCR came back negative.  Two-view chest x-ray showed cardiomegaly with pulmonary vascular congestion.  Chest CTA revealed no acute PE.  It showed mild bilateral groundglass densities that could be related to atelectasis or infectious etiology.  It showed aortic atherosclerosis. EKG showed sinus rhythm with rate of 94 with occasional PVCs and biventricular hypertrophy with  prolonged QT interval with QTC 515 MS.  The patient was given 60 mg of IV Lasix and 10 mg of IV labetalol.  He will be admitted to a progressive unit bed for further evaluation and management.  Hospital Course:   Acute new onset HFrEF  Demand ischemia Here w/ dyspnea, PND. CXR with vascular congestion. BNP markedly elevated. Troponins mildly elevated and flat (91>78), no CP or ischemic ekg findings. Has responded to IV lasix. TTE performed, formal read pending but per cardiology shows EF of 30% - home with lasix and entresto, further meds per cardiology at f/u - has f/u scheduled in heart failure clinic, received CHF teaching, will go home w/ a scale - daughter helping schedule with a pcp, has a new visit w/ [facility] scheduled in May, told him to call and explain now needs hospital f/u, should be able to be seen sooner, will also refer to IM in case has trouble getting seen @ [facility]  Hypertensive urgency.  Likely 2/2 volume overload as has resolved w/ diuresis  Acute kidney injury. Vs ckd as no known baseline. Cr ~1.5  Elevated bilirubin Possibly congestive hepatopathy, possible gilbert - will need repeat LFTs as outpt  Subclinical hypothyroidism Elevated TSH, free t4 wnl - advise outpt repeat TFTs  Health care maintenance - hiv, hcv neg  Procedures:  none   Consultations:  cardiology  Discharge Exam: Vitals:   06/13/20 0748 06/13/20 1058  BP: 126/87 114/69  Pulse: 80 88  Resp: 18 20  Temp: 97.8 F (36.6 C) 98.4 F (36.9  C)  SpO2: 95% 95%    General exam: Appears calm and comfortable  Respiratory system: Clear to auscultation. Respiratory effort normal. Cardiovascular system: S1 & S2 heard, RRR. No JVD, murmurs, rubs, gallops or clicks. No pedal edema. Gastrointestinal system: Abdomen is obese, nondistended, soft and nontender. No organomegaly or masses felt. Normal bowel sounds heard. Central nervous system: Alert and oriented. No focal neurological  deficits. Extremities: Symmetric 5 x 5 power. 1 + LE pitting edema, improved Skin: No rashes, lesions or ulcers Psychiatry: Judgement and insight appear normal. Mood & affect appropriate.   Discharge Instructions   Discharge Instructions    Ambulatory referral to Internal Medicine   Complete by: As directed    Diet - low sodium heart healthy   Complete by: As directed    Increase activity slowly   Complete by: As directed      Allergies as of 06/13/2020   Not on File     Medication List    STOP taking these medications   hydrochlorothiazide 25 MG tablet Commonly known as: HYDRODIURIL     TAKE these medications   furosemide 20 MG tablet Commonly known as: LASIX Take 1 tablet (20 mg total) by mouth daily. Start taking on: June 14, 2020   potassium chloride SA 20 MEQ tablet Commonly known as: KLOR-CON Take 1 tablet (20 mEq total) by mouth daily.   sacubitril-valsartan 24-26 MG Commonly known as: ENTRESTO Take 1 tablet by mouth 2 (two) times daily.      Not on File  Follow-up Information    [facility] Follow up on 06/26/2020.   Specialty: Cardiology Why: at 8:30am. Enter through the [facility] Mall entrance Contact information: [address] [place] [address] [phone]               The results of significant diagnostics from this hospitalization (including imaging, microbiology, ancillary and laboratory) are listed below for reference.    Significant Diagnostic Studies: DG Chest 2 View  Result Date: 06/11/2020 CLINICAL DATA:  Shortness of breath.  Abnormal labs. EXAM: CHEST - 2 VIEW COMPARISON:  06/11/2020 FINDINGS: Mild cardiac enlargement. No pleural effusion or edema identified. Diffuse pulmonary vascular congestion. No airspace densities. Visualized osseous structures are notable for multilevel thoracic spondylosis. IMPRESSION: Cardiac enlargement and pulmonary vascular  congestion. Electronically Signed   By: Signa Kell M.D.   On: 06/11/2020 17:54   CT Angio Chest PE W and/or Wo Contrast  Result Date: 06/11/2020 CLINICAL DATA:  Elevated D-dimer and shortness of breath EXAM: CT ANGIOGRAPHY CHEST WITH CONTRAST TECHNIQUE: Multidetector CT imaging of the chest was performed using the standard protocol during bolus administration of intravenous contrast. Multiplanar CT image reconstructions and MIPs were obtained to evaluate the vascular anatomy. CONTRAST:  5mL OMNIPAQUE IOHEXOL 350 MG/ML SOLN COMPARISON:  None. FINDINGS: Cardiovascular: There is a optimal opacification of the pulmonary arteries. There is no central,segmental, or subsegmental filling defects within the pulmonary arteries. There is moderate cardiomegaly present. No pericardial effusion or thickening. No evidence right heart strain. There is normal three-vessel brachiocephalic anatomy without proximal stenosis. Scattered aortic atherosclerosis is seen. Mediastinum/Nodes: No hilar, mediastinal, or axillary adenopathy. Thyroid gland, trachea, and esophagus demonstrate no significant findings. Lungs/Pleura: Minimal ground-glass opacities are seen throughout both lungs. No pleural effusion or pneumothorax is seen. Upper Abdomen: No acute abnormalities present in the visualized portions of the upper abdomen. Musculoskeletal: No chest wall abnormality. No acute or significant osseous findings. Review of  the MIP images confirms the above findings. IMPRESSION: No central, segmental, or subsegmental pulmonary embolism Minimal bilateral ground-glass opacities which could be due to atelectasis or infectious etiology. Aortic Atherosclerosis (ICD10-I70.0). Electronically Signed   By: Jonna ClarkBindu  Avutu M.D.   On: 06/11/2020 18:40   ECHOCARDIOGRAM COMPLETE  Result Date: 06/13/2020    ECHOCARDIOGRAM REPORT   Patient Name:   Windy FastRONALD Sako Date of Exam: 06/13/2020 Medical Rec #:  [id number]     Height:       73.0 in Accession #:     [id number]    Weight:       247.0 lb Date of Birth:  05-21-1945     BSA:          2.354 m Patient Age:    74 years      BP:           126/87 mmHg Patient Gender: M             HR:           80 bpm. Exam Location:  [facility] Procedure: 2D Echo, Color Doppler and Cardiac Doppler Indications:     CHF-acute diastolic I50.31  History:         Patient has no prior history of Echocardiogram examinations.                  Acute CHF.  Sonographer:     Cristela BlueJerry Hege RDCS (AE) Referring Phys:  [id number] [facility] Diagnosing Phys: Adrian BlackwaterShaukat Khan MD IMPRESSIONS  1. Left ventricular ejection fraction, by estimation, is 30 to 35%. The left ventricle has severely decreased function. The left ventricle demonstrates global hypokinesis. The left ventricular internal cavity size was severely dilated. There is moderate  concentric left ventricular hypertrophy. Left ventricular diastolic parameters are consistent with Grade III diastolic dysfunction (restrictive).  2. Right ventricular systolic function is normal. The right ventricular size is normal.  3. Left atrial size was severely dilated.  4. Right atrial size was mildly dilated.  5. The mitral valve is normal in structure. Mild mitral valve regurgitation. No evidence of mitral stenosis.  6. The aortic valve is normal in structure. Aortic valve regurgitation is not visualized. No aortic stenosis is present.  7. The inferior vena cava is normal in size with greater than 50% respiratory variability, suggesting right atrial pressure of 3 mmHg. Conclusion(s)/Recommendation(s): Findings consistent with dilated cardiomyopathy. FINDINGS  Left Ventricle: Left ventricular ejection fraction, by estimation, is 30 to 35%. The left ventricle has severely decreased function. The left ventricle demonstrates global hypokinesis. The left ventricular internal cavity size was severely dilated. There is moderate concentric left ventricular hypertrophy. Left ventricular diastolic parameters are consistent  with Grade III diastolic dysfunction (restrictive). Right Ventricle: The right ventricular size is normal. No increase in right ventricular wall thickness. Right ventricular systolic function is normal. Left Atrium: Left atrial size was severely dilated. Right Atrium: Right atrial size was mildly dilated. Pericardium: There is no evidence of pericardial effusion. Mitral Valve: The mitral valve is normal in structure. Mild mitral valve regurgitation. No evidence of mitral valve stenosis. Tricuspid Valve: The tricuspid valve is normal in structure. Tricuspid valve regurgitation is mild . No evidence of tricuspid stenosis. Aortic Valve: The aortic valve is normal in structure. Aortic valve regurgitation is not visualized. No aortic stenosis is present. Aortic valve mean gradient measures 3.0 mmHg. Aortic valve peak gradient measures 4.8 mmHg. Aortic valve area, by VTI measures 1.76 cm. Pulmonic Valve: The pulmonic valve was  normal in structure. Pulmonic valve regurgitation is trivial. No evidence of pulmonic stenosis. Aorta: The aortic root is normal in size and structure. Venous: The inferior vena cava is normal in size with greater than 50% respiratory variability, suggesting right atrial pressure of 3 mmHg. IAS/Shunts: No atrial level shunt detected by color flow Doppler.  LEFT VENTRICLE PLAX 2D LVIDd:         7.08 cm      Diastology LVIDs:         5.82 cm      LV e' medial:    3.70 cm/s LV PW:         1.43 cm      LV E/e' medial:  19.6 LV IVS:        1.09 cm      LV e' lateral:   3.48 cm/s LVOT diam:     2.30 cm      LV E/e' lateral: 20.8 LV SV:         27 LV SV Index:   12 LVOT Area:     4.15 cm  LV Volumes (MOD) LV vol d, MOD A2C: 226.0 ml LV vol d, MOD A4C: 236.0 ml LV vol s, MOD A2C: 151.0 ml LV vol s, MOD A4C: 167.0 ml LV SV MOD A2C:     75.0 ml LV SV MOD A4C:     236.0 ml LV SV MOD BP:      78.5 ml RIGHT VENTRICLE RV Basal diam:  3.20 cm RV S prime:     15.80 cm/s TAPSE (M-mode): 4.2 cm LEFT ATRIUM             Index       RIGHT ATRIUM           Index LA diam:      5.70 cm  2.42 cm/m  RA Area:     21.40 cm LA Vol (A2C): 67.1 ml  28.51 ml/m RA Volume:   57.50 ml  24.43 ml/m LA Vol (A4C): 121.0 ml 51.41 ml/m  AORTIC VALVE                   PULMONIC VALVE AV Area (Vmax):    1.82 cm    RVOT Peak grad: 1 mmHg AV Area (Vmean):   1.89 cm AV Area (VTI):     1.76 cm AV Vmax:           110.00 cm/s AV Vmean:          71.900 cm/s AV VTI:            0.154 m AV Peak Grad:      4.8 mmHg AV Mean Grad:      3.0 mmHg LVOT Vmax:         48.30 cm/s LVOT Vmean:        32.700 cm/s LVOT VTI:          0.065 m LVOT/AV VTI ratio: 0.42  AORTA Ao Root diam: 3.20 cm MITRAL VALVE               TRICUSPID VALVE MV Area (PHT): 3.54 cm    TR Peak grad:   10.6 mmHg MV Decel Time: 214 msec    TR Vmax:        163.00 cm/s MV E velocity: 72.40 cm/s MV A velocity: 63.40 cm/s  SHUNTS MV E/A ratio:  1.14        Systemic VTI:  0.07 m  Systemic Diam: 2.30 cm Adrian Blackwater MD Electronically signed by Adrian Blackwater MD Signature Date/Time: 06/13/2020/11:41:11 AM    Final     Microbiology: Recent Results (from the past 240 hour(s))  SARS Coronavirus 2 by RT PCR (hospital order, performed in [facility] hospital lab) Nasopharyngeal Nasopharyngeal Swab     Status: None   Collection Time: 06/11/20  6:46 PM   Specimen: Nasopharyngeal Swab  Result Value Ref Range Status   SARS Coronavirus 2 NEGATIVE NEGATIVE Final    Comment: (NOTE) SARS-CoV-2 target nucleic acids are NOT DETECTED.  The SARS-CoV-2 RNA is generally detectable in upper and lower respiratory specimens during the acute phase of infection. The lowest concentration of SARS-CoV-2 viral copies this assay can detect is 250 copies / mL. A negative result does not preclude SARS-CoV-2 infection and should not be used as the sole basis for treatment or other patient management decisions.  A negative result may occur with improper specimen collection / handling,  submission of specimen other than nasopharyngeal swab, presence of viral mutation(s) within the areas targeted by this assay, and inadequate number of viral copies (<250 copies / mL). A negative result must be combined with clinical observations, patient history, and epidemiological information.  Fact Sheet for Patients:   BoilerBrush.com.cy  Fact Sheet for Healthcare Providers: https://pope.com/  This test is not yet approved or  cleared by the [place] FDA and has been authorized for detection and/or diagnosis of SARS-CoV-2 by FDA under an Emergency Use Authorization (EUA).  This EUA will remain in effect (meaning this test can be used) for the duration of the COVID-19 declaration under Section 564(b)(1) of the Act, 21 U.S.C. section 360bbb-3(b)(1), unless the authorization is terminated or revoked sooner.  Performed at [facility], [address] [place], [place] [address]      Labs: Basic Metabolic Panel: Recent Labs  Lab 06/11/20 1647 06/12/20 0418 06/13/20 0439  NA 144 142 141  K 3.8 3.4* 3.3*  CL 107 104 101  CO2 25 26 27   GLUCOSE 126* 89 98  BUN 24* 27* 33*  CREATININE 1.54* 1.35* 1.53*  CALCIUM 9.4 9.1 9.1   Liver Function Tests: Recent Labs  Lab 06/11/20 1647 06/12/20 0418  AST 32 31  ALT 34 31  ALKPHOS 72 69  BILITOT 1.7* 1.9*  PROT 7.4 7.0  ALBUMIN 4.4 4.2   No results for input(s): LIPASE, AMYLASE in the last 168 hours. No results for input(s): AMMONIA in the last 168 hours. CBC: Recent Labs  Lab 06/11/20 1647 06/12/20 0418  WBC 8.8 7.4  HGB 15.9 15.4  HCT 47.3 45.2  MCV 93.5 92.6  PLT 181 160   Cardiac Enzymes: No results for input(s): CKTOTAL, CKMB, CKMBINDEX, TROPONINI in the last 168 hours. BNP: BNP (last 3 results) Recent Labs    06/11/20 0900  BNP 1,833.3*    ProBNP (last 3 results) No results for input(s): PROBNP in the last 8760 hours.  CBG: No results  for input(s): GLUCAP in the last 168 hours.     Signed:  08/09/20 MD.  [facility] 06/13/2020, 11:48 AM

## 2020-06-13 NOTE — Evaluation (Signed)
Occupational Therapy Evaluation Patient Details Name: Douglas Mccall MRN: [id number] DOB: 03/28/1946 Today's Date: 06/13/2020    History of Present Illness 75 year old male with no known medical history, not seen by medical provider for over 20 years, presented to the emergency department with worsening dyspnea, orthopnea, and PND.  Patient was was negative for PE but chest x-ray revealed cardiomegaly and pulmonary vascular congestion.  We have been consulted for possible new onset HFpEF.   Clinical Impression   Pt reports being independent at home and living alone. Pt with no c/o pain at this time time. Pt demonstrates ambulation, functional transfers, and self care tasks independently. Pt reports current gait as being his baseline. Pt rides bicycle for transportation and has family nearby to assist as needed. Pt with no skilled OT need at this time. OT to SIGN OFF. Thank you for this referral.     Follow Up Recommendations  No OT follow up    Equipment Recommendations  None recommended by OT       Precautions / Restrictions Precautions Precautions: None      Mobility Bed Mobility Overal bed mobility: Needs Assistance Bed Mobility: Supine to Sit;Sit to Supine     Supine to sit: Independent Sit to supine: Independent        Transfers Overall transfer level: Independent Equipment used: None                  Balance Overall balance assessment: Independent   Sitting balance-Leahy Scale: Normal       Standing balance-Leahy Scale: Normal                             ADL either performed or assessed with clinical judgement   ADL Overall ADL's : Independent                                             Vision Baseline Vision/History: Wears glasses Wears Glasses: At all times Patient Visual Report: No change from baseline              Pertinent Vitals/Pain Pain Assessment: No/denies pain     Hand Dominance Right    Extremity/Trunk Assessment Upper Extremity Assessment Upper Extremity Assessment: Overall WFL for tasks assessed   Lower Extremity Assessment Lower Extremity Assessment: Defer to PT evaluation       Communication Communication Communication: HOH   Cognition Arousal/Alertness: Awake/alert Behavior During Therapy: WFL for tasks assessed/performed Overall Cognitive Status: Within Functional Limits for tasks assessed                                                Home Living Family/patient expects to be discharged to:: Private residence Living Arrangements: Alone Available Help at Discharge: Family;Available PRN/intermittently Type of Home: Apartment Home Access: Level entry     Home Layout: One level     Bathroom Shower/Tub: Chief Strategy Officer: Standard     Home Equipment: None          Prior Functioning/Environment Level of Independence: Independent                  AM-PAC OT "6 Clicks" Daily Activity  Outcome Measure Help from another person eating meals?: None Help from another person taking care of personal grooming?: None Help from another person toileting, which includes using toliet, bedpan, or urinal?: None Help from another person bathing (including washing, rinsing, drying)?: None Help from another person to put on and taking off regular upper body clothing?: None Help from another person to put on and taking off regular lower body clothing?: None 6 Click Score: 24   End of Session Nurse Communication: Other (comment) (requests diet edu)  Activity Tolerance: Patient tolerated treatment well Patient left: in bed;Other (comment) (staff present in room)                   Time: 0950-1010 OT Time Calculation (min): 20 min Charges:  OT General Charges $OT Visit: 1 Visit OT Evaluation $OT Eval Low Complexity: 1 Low OT Treatments $Self Care/Home Management : 8-22 mins  Jackquline Denmark, MS, OTR/L ,  CBIS ascom [phone]  06/13/20, 10:24 AM

## 2020-06-13 NOTE — Plan of Care (Signed)

## 2020-06-13 NOTE — TOC Transition Note (Signed)
Transition of Care [facility]) - CM/SW Discharge Note   Patient Details  Name: Douglas Mccall MRN: [id number] Date of Birth: 1945/11/02  Transition of Care [facility]) CM/SW Contact:  Hetty Ely, RN Phone Number: 06/13/2020, 12:10 PM   Clinical Narrative:  Patient to be discharge to home today, given scales and BP unit for home use. Education done by Cariology Nurse who is in the room with patient at the time of delivery. Patient voices understanding to use as instructed.    Final next level of care: Home/Self Care Barriers to Discharge: Barriers Resolved   Patient Goals and CMS Choice Patient states their goals for this hospitalization and ongoing recovery are:: To return home.   Choice offered to / list presented to : NA  Discharge Placement                  Name of family member notified: Patient made arrangement for discharge to home Patient and family notified of of transfer: 06/13/20  Discharge Plan and Services                DME Arranged: N/A DME Agency: NA       HH Arranged: NA HH Agency: NA        Social Determinants of Health (SDOH) Interventions     Readmission Risk Interventions No flowsheet data found.

## 2020-06-13 NOTE — Progress Notes (Signed)
Patient discharged by RN. RN reviewed new medications and discharge instructions with patient. Patient has no further questions at this time. Telemetry discharged and IV removed. Patient has all personal belongings. Discharged to lounge via volunteer services.

## 2020-06-13 NOTE — Progress Notes (Signed)
SUBJECTIVE: Patient resting comfortably. Denies chest pain. States he did not have any dyspnea or PND last night. Doing well overall.   Vitals:   06/12/20 1156 06/12/20 1958 06/13/20 0400 06/13/20 0748  BP: (!) 143/89 130/77 121/83 126/87  Pulse: 67 (!) 45 75 80  Resp: 18 18 16 18   Temp: 98.2 F (36.8 C) 98.4 F (36.9 C) (!) 97.3 F (36.3 C) 97.8 F (36.6 C)  TempSrc:  Oral  Oral  SpO2: 97% 94% (P) 93% 95%  Weight:   (P) 109.8 kg   Height:        Intake/Output Summary (Last 24 hours) at 06/13/2020 1045 Last data filed at 06/12/2020 1958 Gross per 24 hour  Intake 960 ml  Output 1000 ml  Net -40 ml    LABS: Basic Metabolic Panel: Recent Labs    06/12/20 0418 06/13/20 0439  NA 142 141  K 3.4* 3.3*  CL 104 101  CO2 26 27  GLUCOSE 89 98  BUN 27* 33*  CREATININE 1.35* 1.53*  CALCIUM 9.1 9.1   Liver Function Tests: Recent Labs    06/11/20 1647 06/12/20 0418  AST 32 31  ALT 34 31  ALKPHOS 72 69  BILITOT 1.7* 1.9*  PROT 7.4 7.0  ALBUMIN 4.4 4.2   No results for input(s): LIPASE, AMYLASE in the last 72 hours. CBC: Recent Labs    06/11/20 1647 06/12/20 0418  WBC 8.8 7.4  HGB 15.9 15.4  HCT 47.3 45.2  MCV 93.5 92.6  PLT 181 160   Cardiac Enzymes: No results for input(s): CKTOTAL, CKMB, CKMBINDEX, TROPONINI in the last 72 hours. BNP: Invalid input(s): POCBNP D-Dimer: No results for input(s): DDIMER in the last 72 hours. Hemoglobin A1C: Recent Labs    06/12/20 0941  HGBA1C 5.3   Fasting Lipid Panel: Recent Labs    06/12/20 0941  CHOL 172  HDL 44  LDLCALC 116*  TRIG 62  CHOLHDL 3.9   Thyroid Function Tests: Recent Labs    06/12/20 0941  TSH 12.434*   Anemia Panel: No results for input(s): VITAMINB12, FOLATE, FERRITIN, TIBC, IRON, RETICCTPCT in the last 72 hours.   PHYSICAL EXAM General: Well developed, well nourished, in no acute distress HEENT:  Normocephalic and atramatic Neck:  No JVD.  Lungs: Clear bilaterally to auscultation and  percussion. Heart: HRRR . Normal S1 and S2 without gallops or murmurs.  Abdomen: Bowel sounds are positive, abdomen soft and non-tender  Msk:  Back normal, normal gait. Normal strength and tone for age. Extremities: No clubbing, cyanosis or edema.   Neuro: Alert and oriented X 3. Psych:  Good affect, responds appropriately  TELEMETRY: NSR 70/bpm  ASSESSMENT AND PLAN: Patient presented to the emergency department with worsening dyspnea, orthopnea, and PND. Transitioned to oral furosemide 20mg  daily. Patient with new acute combined HFrEF/HFpEF as echocardiogram revealed EF 30% with diffuse hypokinesis and severely dilated LA and LV. HR 60-70 so will hold on beta blocker therapy. Will initiate entresto 24-26 and will have further management completed as an outpatient. From a cardiac point of view patient can be discharged and seen at our office next week.  Active Problems:   Acute CHF (congestive heart failure) (HCC)    , NP-C 06/13/2020 10:45 AM

## 2020-06-16 ENCOUNTER — Telehealth: Payer: Self-pay

## 2020-06-26 ENCOUNTER — Ambulatory Visit: Payer: Medicare Other | Admitting: Family

## 2020-06-27 NOTE — Progress Notes (Signed)
Patient ID: Douglas Mccall, male    DOB: 1945/06/04, 75 y.o.   MRN: [id number]  HPI  Douglas Mccall is a 75 y/o male with a history of HTN and chronic heart failure.   Echo report from 06/13/20 reviewed and showed an EF of 30-35% along with severe LAE and mild Douglas.   Admitted 06/11/20 due to shortness of breath and PND. Cardiology consult obtained. Chest CTA revealed no acute PE. Initially given IV lasix and IV labetolol and transitioned to oral medications. Covid test was negative. Discharged after 2 days.   He presents today for his initial visit with a chief complaint of minimal cough that is present in the mornings. Has associated bilateral knee pain along with this. He denies any difficulty sleeping, abdominal distention, palpitations, pedal edema, chest pain, dizziness, shortness of breath, fatigue or weight gain.   Brings his home weight chart and BP logs in for review. Occasionally eats sausage and gravy for breakfast. Rides his bike everywhere that he can for exercise and, in fact, rode his bike to the office today  Overall he says that he feels "much better" since his recent discharge.   Past Medical History:  Diagnosis Date  . CHF (congestive heart failure) (HCC)   . Hypertension    History reviewed. No pertinent surgical history. Family History  Problem Relation Age of Onset  . Heart disease Mother   . Heart disease Father    Social History   Tobacco Use  . Smoking status: Never Smoker  . Smokeless tobacco: Never Used  Substance Use Topics  . Alcohol use: Not Currently   No Known Allergies Prior to Admission medications   Medication Sig Start Date End Date Taking? Authorizing Provider  furosemide (LASIX) 20 MG tablet Take 1 tablet (20 mg total) by mouth daily. 06/14/20  Yes Wouk, Wilfred Curtis, MD  potassium chloride SA (KLOR-CON) 20 MEQ tablet Take 1 tablet (20 mEq total) by mouth daily. 06/13/20  Yes Wouk, Wilfred Curtis, MD  sacubitril-valsartan (ENTRESTO) 24-26 MG Take 1 tablet  by mouth 2 (two) times daily. 06/13/20  Yes Wouk, Wilfred Curtis, MD    Review of Systems  Constitutional: Negative for appetite change and fatigue.  HENT: Positive for hearing loss. Negative for congestion and sore throat.   Eyes: Negative.   Respiratory: Positive for cough (very little in the mornings). Negative for chest tightness and shortness of breath.   Cardiovascular: Negative for chest pain, palpitations and leg swelling.  Gastrointestinal: Negative for abdominal distention and abdominal pain.  Endocrine: Negative.   Genitourinary: Negative.   Musculoskeletal: Positive for arthralgias (knee pain). Negative for back pain.  Skin: Negative.   Allergic/Immunologic: Negative.   Neurological: Negative for dizziness and light-headedness.  Hematological: Negative for adenopathy. Does not bruise/bleed easily.  Psychiatric/Behavioral: Negative for dysphoric mood and sleep disturbance (sleeping on 2-3 pillows). The patient is not nervous/anxious.    Vitals:   06/30/20 1028  BP: (!) 143/87  Pulse: 92  Resp: 18  SpO2: 95%  Weight: 244 lb (110.7 kg)  Height: 6\' 1"  (1.854 m)   Wt Readings from Last 3 Encounters:  06/30/20 244 lb (110.7 kg)  06/13/20 (P) 242 lb (109.8 kg)   Lab Results  Component Value Date   CREATININE 1.52 (H) 06/30/2020   CREATININE 1.53 (H) 06/13/2020   CREATININE 1.35 (H) 06/12/2020   Physical Exam Vitals and nursing note reviewed.  Constitutional:      Appearance: Normal appearance.  HENT:     Head:  Normocephalic and atraumatic.     Right Ear: Decreased hearing noted.     Left Ear: Decreased hearing noted.  Cardiovascular:     Rate and Rhythm: Normal rate and regular rhythm.  Pulmonary:     Effort: Pulmonary effort is normal. No respiratory distress.     Breath sounds: No wheezing or rales.  Abdominal:     General: There is no distension.     Palpations: Abdomen is soft.  Musculoskeletal:        General: No tenderness.     Cervical back: Neck  supple.     Right lower leg: No edema.     Left lower leg: No edema.  Skin:    General: Skin is warm and dry.  Neurological:     General: No focal deficit present.     Mental Status: He is alert and oriented to person, place, and time.  Psychiatric:        Mood and Affect: Mood normal.        Behavior: Behavior normal.    Assessment & Plan:  1: Chronic heart failure with reduced ejection fraction- - NYHA class I - euvolemic today - weighing daily and home weight chart reviewed; in the last week, weight has declined 3 pounds; reminded to call for an overnight weight gain of >2 pounds or a weekly weight gain of > 5 pounds - not adding salt and has been looking at food labels for sodium content  - sees cardiology (Packer/Khan) 07/02/20 - discussed adding carvedilol, spironolactone, farxiga at future visits along with titrating up entresto if able - BNP 06/11/20 was 1833.3  2: HTN- - BP mildly elevated today; home BP ranges from 104-140/ 68-86 - has initial PCP appointment scheduled with [facility] on 07/04/20 - BMP 06/13/20 reviewed and showed sodium 141, potassium 3.3, creatinine 1.53 and GFR 48 - will recheck BMP today  Medication bottles reviewed.   Return in 1 month or sooner for any questions/problems before then.

## 2020-06-30 ENCOUNTER — Other Ambulatory Visit: Payer: Self-pay

## 2020-06-30 ENCOUNTER — Ambulatory Visit: Payer: Medicare Other | Admitting: Family

## 2020-06-30 ENCOUNTER — Other Ambulatory Visit
Admission: RE | Admit: 2020-06-30 | Discharge: 2020-06-30 | Disposition: A | Discharge status: Home / Self Care | Payer: Medicare Other | Source: Ambulatory Visit | Attending: Family | Admitting: Family

## 2020-06-30 ENCOUNTER — Encounter: Payer: Self-pay | Admitting: Family

## 2020-06-30 VITALS — BP 143/87 | HR 92 | Resp 18 | Ht 73.0 in | Wt 244.0 lb

## 2020-06-30 DIAGNOSIS — M25561 Pain in right knee: Secondary | ICD-10-CM | POA: Insufficient documentation

## 2020-06-30 DIAGNOSIS — I10 Essential (primary) hypertension: Secondary | ICD-10-CM

## 2020-06-30 DIAGNOSIS — Z79899 Other long term (current) drug therapy: Secondary | ICD-10-CM | POA: Insufficient documentation

## 2020-06-30 DIAGNOSIS — I110 Hypertensive heart disease with heart failure: Secondary | ICD-10-CM | POA: Insufficient documentation

## 2020-06-30 DIAGNOSIS — M25562 Pain in left knee: Secondary | ICD-10-CM | POA: Insufficient documentation

## 2020-06-30 DIAGNOSIS — I5022 Chronic systolic (congestive) heart failure: Secondary | ICD-10-CM | POA: Insufficient documentation

## 2020-06-30 DIAGNOSIS — Z8249 Family history of ischemic heart disease and other diseases of the circulatory system: Secondary | ICD-10-CM | POA: Insufficient documentation

## 2020-06-30 DIAGNOSIS — R059 Cough, unspecified: Secondary | ICD-10-CM | POA: Insufficient documentation

## 2020-06-30 LAB — BASIC METABOLIC PANEL
Anion gap: 9 (ref 5–15)
BUN: 31 mg/dL — ABNORMAL HIGH (ref 8–23)
CO2: 25 mmol/L (ref 22–32)
Calcium: 9.3 mg/dL (ref 8.9–10.3)
Chloride: 105 mmol/L (ref 98–111)
Creatinine, Ser: 1.52 mg/dL — ABNORMAL HIGH (ref 0.61–1.24)
GFR, Estimated: 48 mL/min — ABNORMAL LOW (ref >60)
Glucose, Bld: 115 mg/dL — ABNORMAL HIGH (ref 70–99)
Potassium: 4.1 mmol/L (ref 3.5–5.1)
Sodium: 139 mmol/L (ref 135–145)

## 2020-06-30 NOTE — Patient Instructions (Signed)
Continue weighing daily and call for an overnight weight gain of > 2 pounds or a weekly weight gain of >5 pounds. 

## 2020-07-02 ENCOUNTER — Ambulatory Visit: Payer: Medicare Other | Admitting: Family

## 2020-07-02 DIAGNOSIS — I10 Essential (primary) hypertension: Secondary | ICD-10-CM | POA: Diagnosis not present

## 2020-07-02 DIAGNOSIS — I5043 Acute on chronic combined systolic (congestive) and diastolic (congestive) heart failure: Secondary | ICD-10-CM | POA: Diagnosis not present

## 2020-07-02 DIAGNOSIS — R9431 Abnormal electrocardiogram [ECG] [EKG]: Secondary | ICD-10-CM | POA: Diagnosis not present

## 2020-07-02 DIAGNOSIS — R609 Edema, unspecified: Secondary | ICD-10-CM | POA: Diagnosis not present

## 2020-07-04 DIAGNOSIS — I509 Heart failure, unspecified: Secondary | ICD-10-CM | POA: Diagnosis not present

## 2020-07-04 DIAGNOSIS — N189 Chronic kidney disease, unspecified: Secondary | ICD-10-CM | POA: Diagnosis not present

## 2020-07-09 DIAGNOSIS — G4733 Obstructive sleep apnea (adult) (pediatric): Secondary | ICD-10-CM | POA: Diagnosis not present

## 2020-07-10 DIAGNOSIS — G4733 Obstructive sleep apnea (adult) (pediatric): Secondary | ICD-10-CM | POA: Diagnosis not present

## 2020-07-11 DIAGNOSIS — I5043 Acute on chronic combined systolic (congestive) and diastolic (congestive) heart failure: Secondary | ICD-10-CM | POA: Diagnosis not present

## 2020-07-15 DIAGNOSIS — R609 Edema, unspecified: Secondary | ICD-10-CM | POA: Diagnosis not present

## 2020-07-15 DIAGNOSIS — I5041 Acute combined systolic (congestive) and diastolic (congestive) heart failure: Secondary | ICD-10-CM | POA: Diagnosis not present

## 2020-07-15 DIAGNOSIS — I10 Essential (primary) hypertension: Secondary | ICD-10-CM | POA: Diagnosis not present

## 2020-07-15 DIAGNOSIS — R9439 Abnormal result of other cardiovascular function study: Secondary | ICD-10-CM | POA: Diagnosis not present

## 2020-07-24 DIAGNOSIS — I5041 Acute combined systolic (congestive) and diastolic (congestive) heart failure: Secondary | ICD-10-CM | POA: Diagnosis not present

## 2020-07-25 NOTE — Progress Notes (Signed)
Patient ID: Douglas Mccall, male    DOB: 09-11-1945, 75 y.o.   MRN: [id number]  HPI  Douglas Mccall is a 75 y/o male with a history of HTN and chronic heart failure.   Echo report from 06/13/20 reviewed and showed an EF of 30-35% along with severe LAE and mild Douglas.   Admitted 06/11/20 due to shortness of breath and PND. Cardiology consult obtained. Chest CTA revealed no acute PE. Initially given IV lasix and IV labetolol and transitioned to oral medications. Covid test was negative. Discharged after 2 days.   Douglas Mccall presents today for a follow-up visit with a chief complaint of knee pain. Douglas Mccall describes this as having been present for several months. Douglas Mccall has no other symptoms and specifically denies any difficulty sleeping, abdominal distention, palpitations, pedal edema, chest pain, dizziness, fatigue or weight gain.   Had sleep study and stress test since Douglas Mccall was last here.    Past Medical History:  Diagnosis Date  . CHF (congestive heart failure) (HCC)   . Hypertension    No past surgical history on file. Family History  Problem Relation Age of Onset  . Heart disease Mother   . Heart disease Father    Social History   Tobacco Use  . Smoking status: Never Smoker  . Smokeless tobacco: Never Used  Substance Use Topics  . Alcohol use: Not Currently   No Known Allergies  Prior to Admission medications   Medication Sig Start Date End Date Taking? Authorizing Provider  carvedilol (COREG) 3.125 MG tablet Take 3.125 mg by mouth 2 (two) times daily with a meal.   Yes [provider]  dapagliflozin propanediol (FARXIGA) 10 MG TABS tablet Take 10 mg by mouth daily.   Yes [provider]  furosemide (LASIX) 20 MG tablet Take 1 tablet (20 mg total) by mouth daily. 06/14/20  Yes Wouk, Wilfred Curtis, MD  potassium chloride SA (KLOR-CON) 20 MEQ tablet Take 1 tablet (20 mEq total) by mouth daily. 06/13/20  Yes Wouk, Wilfred Curtis, MD  rosuvastatin (CRESTOR) 20 MG tablet Take 20 mg by mouth daily.    Yes [provider]  sacubitril-valsartan (ENTRESTO) 49-51 MG Take 1 tablet by mouth 2 (two) times daily.   Yes [provider]   Review of Systems  Constitutional: Negative for appetite change and fatigue.  HENT: Positive for hearing loss. Negative for congestion and sore throat.   Eyes: Negative.   Respiratory: Negative for cough, chest tightness and shortness of breath.   Cardiovascular: Negative for chest pain, palpitations and leg swelling.  Gastrointestinal: Negative for abdominal distention and abdominal pain.  Endocrine: Negative.   Genitourinary: Negative.   Musculoskeletal: Positive for arthralgias (knee pain). Negative for back pain.  Skin: Negative.   Allergic/Immunologic: Negative.   Neurological: Negative for dizziness and light-headedness.  Hematological: Negative for adenopathy. Does not bruise/bleed easily.  Psychiatric/Behavioral: Negative for dysphoric mood and sleep disturbance (sleeping on 2-3 pillows). The patient is not nervous/anxious.    Vitals:   07/28/20 1056 07/28/20 1142  BP: (!) 134/104 140/78  Pulse: (!) 41   Resp: 18   SpO2: 97%   Weight: 243 lb (110.2 kg)   Height: 5\' 11"  (1.803 m)    Wt Readings from Last 3 Encounters:  07/28/20 243 lb (110.2 kg)  06/30/20 244 lb (110.7 kg)  06/13/20 (P) 242 lb (109.8 kg)   Lab Results  Component Value Date   CREATININE 1.52 (H) 06/30/2020   CREATININE 1.53 (H) 06/13/2020  CREATININE 1.35 (H) 06/12/2020    Physical Exam Vitals and nursing note reviewed.  Constitutional:      Appearance: Normal appearance.  HENT:     Head: Normocephalic and atraumatic.     Right Ear: Decreased hearing noted.     Left Ear: Decreased hearing noted.  Cardiovascular:     Rate and Rhythm: Normal rate and regular rhythm.  Pulmonary:     Effort: Pulmonary effort is normal. No respiratory distress.     Breath sounds: No wheezing or rales.  Abdominal:     General: There is no distension.      Palpations: Abdomen is soft.  Musculoskeletal:        General: No tenderness.     Cervical back: Neck supple.     Right lower leg: No edema.     Left lower leg: No edema.  Skin:    General: Skin is warm and dry.  Neurological:     General: No focal deficit present.     Mental Status: Douglas Mccall is alert and oriented to person, place, and time.  Psychiatric:        Mood and Affect: Mood normal.        Behavior: Behavior normal.    Assessment & Plan:  1: Chronic heart failure with reduced ejection fraction- - NYHA class I - euvolemic today - weighing daily and home weight chart reviewed; reminded to call for an overnight weight gain of >2 pounds or a weekly weight gain of > 5 pounds - weight stable from last visit here 1 month ago - not adding salt and has been looking at food labels for sodium content  - saw cardiology (Packer/Khan) 07/02/20; returns tomorrow - currently on GDMT of carvedilol, farxiga, entresto; consider titrating up entresto or adding spironolactone - bradycardia limits carvedilol titration - quite active with riding his bike  - BNP 06/11/20 was 1833.3  2: HTN- - BP elevated upon arrival with improvement after recheck at the end of the visit - saw (PCP) Bronstein 07/04/20 - BMP 07/04/20 reviewed and showed sodium 143, potassium 4.1, creatinine 1.5 and GFR 46   Medication bottles reviewed.   Due to HF stability, will not make a return appointment at this time. Advised patient to maintain close follow-up with cardiology and PCP and that Douglas Mccall could call back at anytime for questions or for an appointment. Patient was comfortable with this plan.

## 2020-07-28 ENCOUNTER — Ambulatory Visit: Payer: Medicare Other | Attending: Family | Admitting: Family

## 2020-07-28 ENCOUNTER — Other Ambulatory Visit: Payer: Self-pay

## 2020-07-28 ENCOUNTER — Encounter: Payer: Self-pay | Admitting: Family

## 2020-07-28 VITALS — BP 140/78 | HR 41 | Resp 18 | Ht 71.0 in | Wt 243.0 lb

## 2020-07-28 DIAGNOSIS — R001 Bradycardia, unspecified: Secondary | ICD-10-CM | POA: Diagnosis not present

## 2020-07-28 DIAGNOSIS — Z8249 Family history of ischemic heart disease and other diseases of the circulatory system: Secondary | ICD-10-CM | POA: Diagnosis not present

## 2020-07-28 DIAGNOSIS — Z79899 Other long term (current) drug therapy: Secondary | ICD-10-CM | POA: Diagnosis not present

## 2020-07-28 DIAGNOSIS — I110 Hypertensive heart disease with heart failure: Secondary | ICD-10-CM | POA: Diagnosis not present

## 2020-07-28 DIAGNOSIS — I5022 Chronic systolic (congestive) heart failure: Secondary | ICD-10-CM

## 2020-07-28 DIAGNOSIS — Z7984 Long term (current) use of oral hypoglycemic drugs: Secondary | ICD-10-CM | POA: Insufficient documentation

## 2020-07-28 DIAGNOSIS — I10 Essential (primary) hypertension: Secondary | ICD-10-CM

## 2020-07-28 NOTE — Patient Instructions (Addendum)
Continue weighing daily and call for an overnight weight gain of > 2 pounds or a weekly weight gain of >5 pounds.   Call us in the future if you'd like to schedule another appointment 

## 2020-07-29 DIAGNOSIS — I10 Essential (primary) hypertension: Secondary | ICD-10-CM | POA: Diagnosis not present

## 2020-07-29 DIAGNOSIS — R609 Edema, unspecified: Secondary | ICD-10-CM | POA: Diagnosis not present

## 2020-07-29 DIAGNOSIS — I509 Heart failure, unspecified: Secondary | ICD-10-CM | POA: Diagnosis not present

## 2020-07-29 DIAGNOSIS — R0602 Shortness of breath: Secondary | ICD-10-CM | POA: Diagnosis not present

## 2020-07-29 DIAGNOSIS — I420 Dilated cardiomyopathy: Secondary | ICD-10-CM | POA: Diagnosis not present

## 2020-08-29 DIAGNOSIS — I340 Nonrheumatic mitral (valve) insufficiency: Secondary | ICD-10-CM | POA: Diagnosis not present

## 2020-08-29 DIAGNOSIS — I5043 Acute on chronic combined systolic (congestive) and diastolic (congestive) heart failure: Secondary | ICD-10-CM | POA: Diagnosis not present

## 2020-08-29 DIAGNOSIS — E782 Mixed hyperlipidemia: Secondary | ICD-10-CM | POA: Diagnosis not present

## 2020-08-29 DIAGNOSIS — I5042 Chronic combined systolic (congestive) and diastolic (congestive) heart failure: Secondary | ICD-10-CM | POA: Diagnosis not present

## 2020-08-29 DIAGNOSIS — I509 Heart failure, unspecified: Secondary | ICD-10-CM | POA: Diagnosis not present

## 2020-08-29 DIAGNOSIS — I420 Dilated cardiomyopathy: Secondary | ICD-10-CM | POA: Diagnosis not present

## 2020-08-29 DIAGNOSIS — E785 Hyperlipidemia, unspecified: Secondary | ICD-10-CM | POA: Diagnosis not present

## 2020-09-08 DIAGNOSIS — I420 Dilated cardiomyopathy: Secondary | ICD-10-CM | POA: Diagnosis not present

## 2020-09-10 DIAGNOSIS — R1031 Right lower quadrant pain: Secondary | ICD-10-CM | POA: Diagnosis not present

## 2020-09-12 DIAGNOSIS — R1030 Lower abdominal pain, unspecified: Secondary | ICD-10-CM | POA: Diagnosis not present

## 2020-09-12 DIAGNOSIS — I10 Essential (primary) hypertension: Secondary | ICD-10-CM | POA: Diagnosis not present

## 2020-09-12 DIAGNOSIS — R0602 Shortness of breath: Secondary | ICD-10-CM | POA: Diagnosis not present

## 2021-05-04 ENCOUNTER — Encounter: Admission: EM | Disposition: E | Payer: Self-pay | Source: Home / Self Care | Attending: Surgery

## 2021-05-04 ENCOUNTER — Emergency Department: Payer: Medicare Other

## 2021-05-04 ENCOUNTER — Inpatient Hospital Stay: Payer: Medicare Other | Admitting: Anesthesiology

## 2021-05-04 ENCOUNTER — Encounter: Payer: Self-pay | Admitting: Emergency Medicine

## 2021-05-04 ENCOUNTER — Inpatient Hospital Stay
Admission: EM | Admit: 2021-05-04 | Discharge: 2021-05-10 | DRG: 853 | Disposition: E | Payer: Medicare Other | Attending: Surgery | Admitting: Surgery

## 2021-05-04 ENCOUNTER — Other Ambulatory Visit: Payer: Self-pay

## 2021-05-04 DIAGNOSIS — J95821 Acute postprocedural respiratory failure: Secondary | ICD-10-CM | POA: Diagnosis not present

## 2021-05-04 DIAGNOSIS — Z79899 Other long term (current) drug therapy: Secondary | ICD-10-CM

## 2021-05-04 DIAGNOSIS — Z20822 Contact with and (suspected) exposure to covid-19: Secondary | ICD-10-CM | POA: Diagnosis not present

## 2021-05-04 DIAGNOSIS — R198 Other specified symptoms and signs involving the digestive system and abdomen: Secondary | ICD-10-CM

## 2021-05-04 DIAGNOSIS — Z8249 Family history of ischemic heart disease and other diseases of the circulatory system: Secondary | ICD-10-CM | POA: Diagnosis not present

## 2021-05-04 DIAGNOSIS — R109 Unspecified abdominal pain: Secondary | ICD-10-CM | POA: Diagnosis not present

## 2021-05-04 DIAGNOSIS — E878 Other disorders of electrolyte and fluid balance, not elsewhere classified: Secondary | ICD-10-CM | POA: Diagnosis present

## 2021-05-04 DIAGNOSIS — K5700 Diverticulitis of small intestine with perforation and abscess without bleeding: Secondary | ICD-10-CM | POA: Diagnosis present

## 2021-05-04 DIAGNOSIS — I420 Dilated cardiomyopathy: Secondary | ICD-10-CM | POA: Diagnosis present

## 2021-05-04 DIAGNOSIS — Z6833 Body mass index (BMI) 33.0-33.9, adult: Secondary | ICD-10-CM

## 2021-05-04 DIAGNOSIS — K429 Umbilical hernia without obstruction or gangrene: Secondary | ICD-10-CM | POA: Diagnosis present

## 2021-05-04 DIAGNOSIS — E871 Hypo-osmolality and hyponatremia: Secondary | ICD-10-CM | POA: Diagnosis present

## 2021-05-04 DIAGNOSIS — J9690 Respiratory failure, unspecified, unspecified whether with hypoxia or hypercapnia: Secondary | ICD-10-CM

## 2021-05-04 DIAGNOSIS — K668 Other specified disorders of peritoneum: Secondary | ICD-10-CM | POA: Diagnosis not present

## 2021-05-04 DIAGNOSIS — I9589 Other hypotension: Secondary | ICD-10-CM | POA: Diagnosis present

## 2021-05-04 DIAGNOSIS — J984 Other disorders of lung: Secondary | ICD-10-CM | POA: Diagnosis not present

## 2021-05-04 DIAGNOSIS — N179 Acute kidney failure, unspecified: Secondary | ICD-10-CM | POA: Diagnosis present

## 2021-05-04 DIAGNOSIS — E872 Acidosis, unspecified: Secondary | ICD-10-CM | POA: Diagnosis not present

## 2021-05-04 DIAGNOSIS — R1084 Generalized abdominal pain: Secondary | ICD-10-CM | POA: Diagnosis not present

## 2021-05-04 DIAGNOSIS — I10 Essential (primary) hypertension: Secondary | ICD-10-CM | POA: Diagnosis not present

## 2021-05-04 DIAGNOSIS — R52 Pain, unspecified: Secondary | ICD-10-CM

## 2021-05-04 DIAGNOSIS — I5021 Acute systolic (congestive) heart failure: Secondary | ICD-10-CM | POA: Diagnosis not present

## 2021-05-04 DIAGNOSIS — R112 Nausea with vomiting, unspecified: Secondary | ICD-10-CM | POA: Diagnosis not present

## 2021-05-04 DIAGNOSIS — I700 Atherosclerosis of aorta: Secondary | ICD-10-CM | POA: Diagnosis not present

## 2021-05-04 DIAGNOSIS — K659 Peritonitis, unspecified: Secondary | ICD-10-CM | POA: Diagnosis present

## 2021-05-04 DIAGNOSIS — I110 Hypertensive heart disease with heart failure: Secondary | ICD-10-CM | POA: Diagnosis present

## 2021-05-04 DIAGNOSIS — Z66 Do not resuscitate: Secondary | ICD-10-CM | POA: Diagnosis not present

## 2021-05-04 DIAGNOSIS — R197 Diarrhea, unspecified: Secondary | ICD-10-CM | POA: Diagnosis not present

## 2021-05-04 DIAGNOSIS — A419 Sepsis, unspecified organism: Principal | ICD-10-CM | POA: Diagnosis present

## 2021-05-04 DIAGNOSIS — R001 Bradycardia, unspecified: Secondary | ICD-10-CM | POA: Diagnosis not present

## 2021-05-04 DIAGNOSIS — K5730 Diverticulosis of large intestine without perforation or abscess without bleeding: Secondary | ICD-10-CM | POA: Diagnosis not present

## 2021-05-04 DIAGNOSIS — E669 Obesity, unspecified: Secondary | ICD-10-CM | POA: Diagnosis present

## 2021-05-04 DIAGNOSIS — R6521 Severe sepsis with septic shock: Secondary | ICD-10-CM | POA: Diagnosis not present

## 2021-05-04 DIAGNOSIS — I5022 Chronic systolic (congestive) heart failure: Secondary | ICD-10-CM | POA: Diagnosis not present

## 2021-05-04 DIAGNOSIS — K650 Generalized (acute) peritonitis: Secondary | ICD-10-CM | POA: Diagnosis not present

## 2021-05-04 DIAGNOSIS — I959 Hypotension, unspecified: Secondary | ICD-10-CM | POA: Diagnosis not present

## 2021-05-04 HISTORY — PX: LAPAROTOMY: SHX154

## 2021-05-04 HISTORY — PX: BOWEL RESECTION: SHX1257

## 2021-05-04 LAB — CBC WITH DIFFERENTIAL/PLATELET
Abs Immature Granulocytes: 0.41 10*3/uL — ABNORMAL HIGH (ref 0.00–0.07)
Abs Immature Granulocytes: 0.43 10*3/uL — ABNORMAL HIGH (ref 0.00–0.07)
Basophils Absolute: 0 10*3/uL (ref 0.0–0.1)
Basophils Absolute: 0.1 10*3/uL (ref 0.0–0.1)
Basophils Relative: 0 %
Basophils Relative: 1 %
Eosinophils Absolute: 0.1 10*3/uL (ref 0.0–0.5)
Eosinophils Absolute: 0 10*3/uL (ref 0.0–0.5)
Eosinophils Relative: 1 %
Eosinophils Relative: 0 %
HCT: 42.4 % (ref 39.0–52.0)
HCT: 32.4 % — ABNORMAL LOW (ref 39.0–52.0)
Hemoglobin: 14.6 g/dL (ref 13.0–17.0)
Hemoglobin: 10.8 g/dL — ABNORMAL LOW (ref 13.0–17.0)
Immature Granulocytes: 5 %
Immature Granulocytes: 6 %
Lymphocytes Relative: 5 %
Lymphocytes Relative: 15 %
Lymphs Abs: 0.4 10*3/uL — ABNORMAL LOW (ref 0.7–4.0)
Lymphs Abs: 1.1 10*3/uL (ref 0.7–4.0)
MCH: 31.1 pg (ref 26.0–34.0)
MCH: 31.5 pg (ref 26.0–34.0)
MCHC: 34.4 g/dL (ref 30.0–36.0)
MCHC: 33.3 g/dL (ref 30.0–36.0)
MCV: 90.4 fL (ref 80.0–100.0)
MCV: 94.5 fL (ref 80.0–100.0)
Monocytes Absolute: 0.3 10*3/uL (ref 0.1–1.0)
Monocytes Absolute: 0.4 10*3/uL (ref 0.1–1.0)
Monocytes Relative: 3 %
Monocytes Relative: 5 %
Neutro Abs: 8 10*3/uL — ABNORMAL HIGH (ref 1.7–7.7)
Neutro Abs: 5.5 10*3/uL (ref 1.7–7.7)
Neutrophils Relative %: 86 %
Neutrophils Relative %: 73 %
Platelets: 277 10*3/uL (ref 150–400)
Platelets: 167 10*3/uL (ref 150–400)
RBC: 4.69 MIL/uL (ref 4.22–5.81)
RBC: 3.43 MIL/uL — ABNORMAL LOW (ref 4.22–5.81)
RDW: 14.3 % (ref 11.5–15.5)
RDW: 14.8 % (ref 11.5–15.5)
WBC Morphology: INCREASED
WBC: 9.2 10*3/uL (ref 4.0–10.5)
WBC: 7.6 10*3/uL (ref 4.0–10.5)
nRBC: 0 % (ref 0.0–0.2)
nRBC: 0.3 % — ABNORMAL HIGH (ref 0.0–0.2)

## 2021-05-04 LAB — LACTIC ACID, PLASMA
Lactic Acid, Venous: 3.5 mmol/L (ref 0.5–1.9)
Lactic Acid, Venous: 2 mmol/L (ref 0.5–1.9)

## 2021-05-04 LAB — RENAL FUNCTION PANEL
Albumin: 1.8 g/dL — ABNORMAL LOW (ref 3.5–5.0)
Anion gap: 17 — ABNORMAL HIGH (ref 5–15)
BUN: 94 mg/dL — ABNORMAL HIGH (ref 8–23)
CO2: 19 mmol/L — ABNORMAL LOW (ref 22–32)
Calcium: 8 mg/dL — ABNORMAL LOW (ref 8.9–10.3)
Chloride: 99 mmol/L (ref 98–111)
Creatinine, Ser: 4.82 mg/dL — ABNORMAL HIGH (ref 0.61–1.24)
GFR, Estimated: 12 mL/min — ABNORMAL LOW (ref >60)
Glucose, Bld: 379 mg/dL — ABNORMAL HIGH (ref 70–99)
Phosphorus: 7.4 mg/dL — ABNORMAL HIGH (ref 2.5–4.6)
Potassium: 4.7 mmol/L (ref 3.5–5.1)
Sodium: 135 mmol/L (ref 135–145)

## 2021-05-04 LAB — COMPREHENSIVE METABOLIC PANEL
ALT: 30 U/L (ref 0–44)
AST: 49 U/L — ABNORMAL HIGH (ref 15–41)
Albumin: 2.7 g/dL — ABNORMAL LOW (ref 3.5–5.0)
Alkaline Phosphatase: 83 U/L (ref 38–126)
Anion gap: 14 (ref 5–15)
BUN: 104 mg/dL — ABNORMAL HIGH (ref 8–23)
CO2: 22 mmol/L (ref 22–32)
Calcium: 8.8 mg/dL — ABNORMAL LOW (ref 8.9–10.3)
Chloride: 97 mmol/L — ABNORMAL LOW (ref 98–111)
Creatinine, Ser: 5.66 mg/dL — ABNORMAL HIGH (ref 0.61–1.24)
GFR, Estimated: 10 mL/min — ABNORMAL LOW (ref >60)
Glucose, Bld: 180 mg/dL — ABNORMAL HIGH (ref 70–99)
Potassium: 4.3 mmol/L (ref 3.5–5.1)
Sodium: 133 mmol/L — ABNORMAL LOW (ref 135–145)
Total Bilirubin: 3.5 mg/dL — ABNORMAL HIGH (ref 0.3–1.2)
Total Protein: 7.1 g/dL (ref 6.5–8.1)

## 2021-05-04 LAB — BLOOD GAS, ARTERIAL
Acid-base deficit: 10 mmol/L — ABNORMAL HIGH (ref 0.0–2.0)
Bicarbonate: 19.2 mmol/L — ABNORMAL LOW (ref 20.0–28.0)
FIO2: 1
MECHVT: 500 mL
O2 Saturation: 87.6 %
PEEP: 5 cmH2O
Patient temperature: 37
RATE: 28 resp/min
pCO2 arterial: 55 mmHg — ABNORMAL HIGH (ref 32.0–48.0)
pH, Arterial: 7.15 — CL (ref 7.350–7.450)
pO2, Arterial: 70 mmHg — ABNORMAL LOW (ref 83.0–108.0)

## 2021-05-04 LAB — RESP PANEL BY RT-PCR (FLU A&B, COVID) ARPGX2
Influenza A by PCR: NEGATIVE
Influenza B by PCR: NEGATIVE
SARS Coronavirus 2 by RT PCR: NEGATIVE

## 2021-05-04 LAB — T4, FREE: Free T4: 0.87 ng/dL (ref 0.61–1.12)

## 2021-05-04 LAB — BRAIN NATRIURETIC PEPTIDE: B Natriuretic Peptide: 95.7 pg/mL (ref 0.0–100.0)

## 2021-05-04 LAB — TROPONIN I (HIGH SENSITIVITY)
Troponin I (High Sensitivity): 28 ng/L — ABNORMAL HIGH (ref <18)
Troponin I (High Sensitivity): 24 ng/L — ABNORMAL HIGH (ref <18)

## 2021-05-04 LAB — TSH: TSH: 8.483 u[IU]/mL — ABNORMAL HIGH (ref 0.350–4.500)

## 2021-05-04 LAB — LIPASE, BLOOD: Lipase: 28 U/L (ref 11–51)

## 2021-05-04 LAB — PROCALCITONIN: Procalcitonin: 51.95 ng/mL

## 2021-05-04 LAB — GLUCOSE, CAPILLARY: Glucose-Capillary: 108 mg/dL — ABNORMAL HIGH (ref 70–99)

## 2021-05-04 LAB — CBG MONITORING, ED: Glucose-Capillary: 110 mg/dL — ABNORMAL HIGH (ref 70–99)

## 2021-05-04 LAB — ABO/RH: ABO/RH(D): AB POS

## 2021-05-04 LAB — MRSA NEXT GEN BY PCR, NASAL: MRSA by PCR Next Gen: NOT DETECTED

## 2021-05-04 SURGERY — EXCISION, SMALL INTESTINE
Anesthesia: General | Site: Abdomen

## 2021-05-04 MED ORDER — ALBUMIN HUMAN 5 % IV SOLN
INTRAVENOUS | Status: AC
  Filled 2021-05-04: qty 500

## 2021-05-04 MED ORDER — BUPIVACAINE LIPOSOME 1.3 % IJ SUSP
INTRAMUSCULAR | Status: AC
  Filled 2021-05-04: qty 20

## 2021-05-04 MED ORDER — FLUCONAZOLE IN SODIUM CHLORIDE 200-0.9 MG/100ML-% IV SOLN: 200.0000 mg | INTRAVENOUS | Status: DC

## 2021-05-04 MED ORDER — PHENYLEPHRINE HCL-NACL 20-0.9 MG/250ML-% IV SOLN
INTRAVENOUS | Status: DC | PRN
  Administered 2021-05-04: 80 ug/min via INTRAVENOUS

## 2021-05-04 MED ORDER — SUCCINYLCHOLINE CHLORIDE 200 MG/10ML IV SOSY
PREFILLED_SYRINGE | INTRAVENOUS | Status: DC | PRN
Start: 2021-05-04 — End: 2021-05-04
  Administered 2021-05-04: 100 mg via INTRAVENOUS

## 2021-05-04 MED ORDER — SODIUM CHLORIDE 0.9 % IV SOLN: 250.0000 mL | INTRAVENOUS | Status: DC

## 2021-05-04 MED ORDER — PIPERACILLIN-TAZOBACTAM IN DEX 2-0.25 GM/50ML IV SOLN
2.2500 g | Freq: Three times a day (TID) | INTRAVENOUS | Status: DC
Start: 2021-05-04 — End: 2021-05-05
  Filled 2021-05-04 (×3): qty 50

## 2021-05-04 MED ORDER — SODIUM CHLORIDE 0.9 % IV BOLUS
1000.0000 mL | Freq: Once | INTRAVENOUS | Status: AC
  Administered 2021-05-04: 12:00:00 1000 mL via INTRAVENOUS

## 2021-05-04 MED ORDER — SODIUM CHLORIDE (PF) 0.9 % IJ SOLN
INTRAMUSCULAR | Status: AC
  Filled 2021-05-04: qty 10

## 2021-05-04 MED ORDER — EPHEDRINE SULFATE 50 MG/ML IJ SOLN
INTRAMUSCULAR | Status: DC | PRN
  Administered 2021-05-04: 10 mg via INTRAVENOUS
  Administered 2021-05-04: 5 mg via INTRAVENOUS

## 2021-05-04 MED ORDER — ALBUMIN HUMAN 5 % IV SOLN: INTRAVENOUS | Status: DC | PRN

## 2021-05-04 MED ORDER — SODIUM CHLORIDE 0.9 % IV SOLN: 10.0000 mL/h | Freq: Once | INTRAVENOUS | Status: DC

## 2021-05-04 MED ORDER — ROCURONIUM BROMIDE 100 MG/10ML IV SOLN
INTRAVENOUS | Status: DC | PRN
  Administered 2021-05-04: 10 mg via INTRAVENOUS
  Administered 2021-05-04: 30 mg via INTRAVENOUS

## 2021-05-04 MED ORDER — MIDAZOLAM BOLUS VIA INFUSION
0.0000 mg | INTRAVENOUS | Status: DC | PRN
  Filled 2021-05-04: qty 5

## 2021-05-04 MED ORDER — SODIUM CHLORIDE FLUSH 0.9 % IV SOLN
INTRAVENOUS | Status: AC
  Filled 2021-05-04: qty 10

## 2021-05-04 MED ORDER — FENTANYL CITRATE PF 50 MCG/ML IJ SOSY: 25.0000 ug | PREFILLED_SYRINGE | Freq: Once | INTRAMUSCULAR | Status: DC

## 2021-05-04 MED ORDER — SUCCINYLCHOLINE CHLORIDE 200 MG/10ML IV SOSY
PREFILLED_SYRINGE | INTRAVENOUS | Status: AC
  Filled 2021-05-04: qty 10

## 2021-05-04 MED ORDER — NOREPINEPHRINE 4 MG/250ML-% IV SOLN
0.0000 ug/min | INTRAVENOUS | Status: DC
  Administered 2021-05-04: 13:00:00 5 ug/min via INTRAVENOUS
  Filled 2021-05-04: qty 250

## 2021-05-04 MED ORDER — FENTANYL 2500MCG IN NS 250ML (10MCG/ML) PREMIX INFUSION
25.0000 ug/h | INTRAVENOUS | Status: DC
  Administered 2021-05-04: 18:00:00 25 ug/h via INTRAVENOUS
  Filled 2021-05-04: qty 250

## 2021-05-04 MED ORDER — ETOMIDATE 2 MG/ML IV SOLN
INTRAVENOUS | Status: AC
  Filled 2021-05-04: qty 10

## 2021-05-04 MED ORDER — VASOPRESSIN 20 UNIT/ML IV SOLN
INTRAVENOUS | Status: AC
  Filled 2021-05-04: qty 1

## 2021-05-04 MED ORDER — FENTANYL BOLUS VIA INFUSION
25.0000 ug | INTRAVENOUS | Status: DC | PRN
  Administered 2021-05-04: 18:00:00 25 ug via INTRAVENOUS
  Filled 2021-05-04: qty 100

## 2021-05-04 MED ORDER — EPINEPHRINE HCL 5 MG/250ML IV SOLN IN NS
0.5000 ug/min | INTRAVENOUS | Status: DC
  Filled 2021-05-04: qty 250

## 2021-05-04 MED ORDER — FENTANYL CITRATE (PF) 100 MCG/2ML IJ SOLN
INTRAMUSCULAR | Status: AC
  Filled 2021-05-04: qty 2

## 2021-05-04 MED ORDER — ONDANSETRON HCL 4 MG/2ML IJ SOLN
4.0000 mg | Freq: Once | INTRAMUSCULAR | Status: AC
  Administered 2021-05-04: 12:00:00 4 mg via INTRAVENOUS
  Filled 2021-05-04: qty 2

## 2021-05-04 MED ORDER — LIDOCAINE HCL (PF) 2 % IJ SOLN
INTRAMUSCULAR | Status: AC
  Filled 2021-05-04: qty 5

## 2021-05-04 MED ORDER — NOREPINEPHRINE 4 MG/250ML-% IV SOLN
2.0000 ug/min | INTRAVENOUS | Status: DC
Start: 2021-05-04 — End: 2021-05-04

## 2021-05-04 MED ORDER — ROCURONIUM BROMIDE 10 MG/ML (PF) SYRINGE
PREFILLED_SYRINGE | INTRAVENOUS | Status: AC
  Filled 2021-05-04: qty 10

## 2021-05-04 MED ORDER — MIDAZOLAM-SODIUM CHLORIDE 100-0.9 MG/100ML-% IV SOLN
0.0000 mg/h | INTRAVENOUS | Status: DC
  Administered 2021-05-04: 18:00:00 2 mg/h via INTRAVENOUS
  Filled 2021-05-04: qty 100

## 2021-05-04 MED ORDER — HEPARIN SODIUM (PORCINE) 5000 UNIT/ML IJ SOLN: 5000.0000 [IU] | Freq: Three times a day (TID) | INTRAMUSCULAR | Status: DC

## 2021-05-04 MED ORDER — PHENYLEPHRINE HCL (PRESSORS) 10 MG/ML IV SOLN
INTRAVENOUS | Status: DC | PRN
  Administered 2021-05-04: 160 ug via INTRAVENOUS
  Administered 2021-05-04: 80 ug via INTRAVENOUS
  Administered 2021-05-04 (×2): 160 ug via INTRAVENOUS

## 2021-05-04 MED ORDER — LIDOCAINE HCL (CARDIAC) PF 100 MG/5ML IV SOSY
PREFILLED_SYRINGE | INTRAVENOUS | Status: DC | PRN
  Administered 2021-05-04: 100 mg via INTRAVENOUS

## 2021-05-04 MED ORDER — ETOMIDATE 2 MG/ML IV SOLN
INTRAVENOUS | Status: DC | PRN
  Administered 2021-05-04: 18 mg via INTRAVENOUS

## 2021-05-04 MED ORDER — NOREPINEPHRINE 4 MG/250ML-% IV SOLN
INTRAVENOUS | Status: AC
  Filled 2021-05-04: qty 250

## 2021-05-04 MED ORDER — PIPERACILLIN-TAZOBACTAM 3.375 G IVPB 30 MIN
3.3750 g | Freq: Once | INTRAVENOUS | Status: AC
  Administered 2021-05-04: 13:00:00 3.375 g via INTRAVENOUS
  Filled 2021-05-04: qty 50

## 2021-05-04 MED ORDER — SODIUM CHLORIDE 0.9 % IV BOLUS
1000.0000 mL | Freq: Once | INTRAVENOUS | Status: AC
  Administered 2021-05-04: 11:00:00 1000 mL via INTRAVENOUS

## 2021-05-04 MED ORDER — BUPIVACAINE-EPINEPHRINE (PF) 0.25% -1:200000 IJ SOLN
INTRAMUSCULAR | Status: AC
  Filled 2021-05-04: qty 30

## 2021-05-04 MED ORDER — CHLORHEXIDINE GLUCONATE 0.12% ORAL RINSE (MEDLINE KIT): 15.0000 mL | Freq: Two times a day (BID) | OROMUCOSAL | Status: DC

## 2021-05-04 MED ORDER — SODIUM CHLORIDE 0.9 % IV SOLN: INTRAVENOUS | Status: DC

## 2021-05-04 MED ORDER — 0.9 % SODIUM CHLORIDE (POUR BTL) OPTIME
TOPICAL | Status: DC | PRN
  Administered 2021-05-04: 17:00:00 3600 mL

## 2021-05-04 MED ORDER — ALBUTEROL SULFATE (2.5 MG/3ML) 0.083% IN NEBU: 2.5000 mg | INHALATION_SOLUTION | Freq: Four times a day (QID) | RESPIRATORY_TRACT | Status: DC

## 2021-05-04 MED ORDER — VASOPRESSIN 20 UNITS/100 ML INFUSION FOR SHOCK
0.0000 [IU]/min | INTRAVENOUS | Status: DC
  Filled 2021-05-04: qty 100

## 2021-05-04 MED ORDER — VASOPRESSIN 20 UNIT/ML IV SOLN
INTRAVENOUS | Status: DC | PRN
  Administered 2021-05-04: 2 [IU] via INTRAVENOUS
  Administered 2021-05-04 (×2): 4 [IU] via INTRAVENOUS
  Administered 2021-05-04: 2 [IU] via INTRAVENOUS
  Administered 2021-05-04 (×2): 4 [IU] via INTRAVENOUS
  Administered 2021-05-04 (×3): 2 [IU] via INTRAVENOUS

## 2021-05-04 MED ORDER — CHLORHEXIDINE GLUCONATE CLOTH 2 % EX PADS
6.0000 | MEDICATED_PAD | Freq: Every day | CUTANEOUS | Status: DC
  Administered 2021-05-04: 18:00:00 6 via TOPICAL

## 2021-05-04 MED ORDER — MORPHINE SULFATE (PF) 2 MG/ML IV SOLN
2.0000 mg | Freq: Once | INTRAVENOUS | Status: DC
  Filled 2021-05-04: qty 1

## 2021-05-04 MED ORDER — PANTOPRAZOLE SODIUM 40 MG IV SOLR: 40.0000 mg | INTRAVENOUS | Status: DC

## 2021-05-04 MED ORDER — FLUCONAZOLE IN SODIUM CHLORIDE 400-0.9 MG/200ML-% IV SOLN
800.0000 mg | Freq: Once | INTRAVENOUS | Status: DC
  Filled 2021-05-04 (×2): qty 400

## 2021-05-04 MED ORDER — FENTANYL CITRATE (PF) 100 MCG/2ML IJ SOLN
INTRAMUSCULAR | Status: DC | PRN
  Administered 2021-05-04: 50 ug via INTRAVENOUS

## 2021-05-04 MED ORDER — SODIUM CHLORIDE (PF) 0.9 % IJ SOLN
INTRAMUSCULAR | Status: DC | PRN
  Administered 2021-05-04: 60 mL via INTRAMUSCULAR

## 2021-05-04 MED ORDER — ORAL CARE MOUTH RINSE: 15.0000 mL | OROMUCOSAL | Status: DC

## 2021-05-04 MED ORDER — SODIUM CHLORIDE 0.9 % IV SOLN: INTRAVENOUS | Status: DC | PRN

## 2021-05-04 MED ORDER — PHENYLEPHRINE HCL-NACL 20-0.9 MG/250ML-% IV SOLN
INTRAVENOUS | Status: AC
  Filled 2021-05-04: qty 250

## 2021-05-04 SURGICAL SUPPLY — 47 items
BNDG GAUZE ELAST 4 BULKY (GAUZE/BANDAGES/DRESSINGS) ×3 IMPLANT
BULB RESERV EVAC DRAIN JP 100C (MISCELLANEOUS) ×6 IMPLANT
CHLORAPREP W/TINT 26 (MISCELLANEOUS) ×1 IMPLANT
DRAIN CHANNEL JP 19F (MISCELLANEOUS) ×6 IMPLANT
DRAPE LAPAROTOMY 100X77 ABD (DRAPES) ×4 IMPLANT
DRSG OPSITE POSTOP 4X12 (GAUZE/BANDAGES/DRESSINGS) IMPLANT
DRSG TEGADERM 4X10 (GAUZE/BANDAGES/DRESSINGS) ×3 IMPLANT
ELECT BLADE 6.5 EXT (BLADE) ×4 IMPLANT
ELECT CAUTERY BLADE TIP 2.5 (TIP) ×4
ELECT REM PT RETURN 9FT ADLT (ELECTROSURGICAL) ×4
ELECTRODE CAUTERY BLDE TIP 2.5 (TIP) ×2 IMPLANT
ELECTRODE REM PT RTRN 9FT ADLT (ELECTROSURGICAL) ×2 IMPLANT
GAUZE 4X4 16PLY ~~LOC~~+RFID DBL (SPONGE) ×4 IMPLANT
GAUZE SPONGE 4X4 12PLY STRL (GAUZE/BANDAGES/DRESSINGS) ×7 IMPLANT
GLOVE SURG SYN 7.0 (GLOVE) ×24 IMPLANT
GLOVE SURG SYN 7.0 PF PI (GLOVE) ×2 IMPLANT
GLOVE SURG SYN 7.5  E (GLOVE) ×8
GLOVE SURG SYN 7.5 E (GLOVE) ×8 IMPLANT
GLOVE SURG SYN 7.5 PF PI (GLOVE) ×2 IMPLANT
GOWN STRL REUS W/ TWL LRG LVL3 (GOWN DISPOSABLE) ×8 IMPLANT
GOWN STRL REUS W/TWL LRG LVL3 (GOWN DISPOSABLE) ×8
LABEL OR SOLS (LABEL) ×1 IMPLANT
LIGASURE IMPACT 36 18CM CVD LR (INSTRUMENTS) IMPLANT
MANIFOLD NEPTUNE II (INSTRUMENTS) ×4 IMPLANT
NEEDLE HYPO 22GX1.5 SAFETY (NEEDLE) ×4 IMPLANT
NS IRRIG 1000ML POUR BTL (IV SOLUTION) ×16 IMPLANT
PACK BASIN MAJOR ARMC (MISCELLANEOUS) ×4 IMPLANT
PACK COLON CLEAN CLOSURE (MISCELLANEOUS) ×1 IMPLANT
RELOAD PROXIMATE 75MM BLUE (ENDOMECHANICALS) ×12 IMPLANT
RELOAD STAPLE 75 3.8 BLU REG (ENDOMECHANICALS) IMPLANT
SEALER TISSUE X1 CVD JAW (INSTRUMENTS) ×3 IMPLANT
SEPRAFILM MEMBRANE 5X6 (MISCELLANEOUS) IMPLANT
SPONGE T-LAP 18X18 ~~LOC~~+RFID (SPONGE) ×9 IMPLANT
STAPLER SKIN PROX 35W (STAPLE) ×1 IMPLANT
SUT ETH BLK MONO 3 0 FS 1 12/B (SUTURE) ×6 IMPLANT
SUT PDS AB 1 CT1 36 (SUTURE) ×7 IMPLANT
SUT PROLENE 2 0 SH DA (SUTURE) IMPLANT
SUT SILK 2 0 (SUTURE) ×2
SUT SILK 2-0 18XBRD TIE 12 (SUTURE) ×2 IMPLANT
SUT SILK 3-0 (SUTURE) ×10 IMPLANT
SUT VIC AB 3-0 SH 27 (SUTURE) ×4
SUT VIC AB 3-0 SH 27X BRD (SUTURE) ×3 IMPLANT
SYR 10ML LL (SYRINGE) ×4 IMPLANT
SYR 20ML LL LF (SYRINGE) IMPLANT
Suture Nylon 3-0 ×6 IMPLANT
TRAY FOLEY MTR SLVR 16FR STAT (SET/KITS/TRAYS/PACK) ×4 IMPLANT
WATER STERILE IRR 500ML POUR (IV SOLUTION) ×4 IMPLANT

## 2021-05-05 ENCOUNTER — Encounter: Payer: Self-pay | Admitting: Surgery

## 2021-05-05 LAB — CORTISOL: Cortisol, Plasma: 56.2 ug/dL

## 2021-05-06 LAB — SURGICAL PATHOLOGY

## 2021-05-07 LAB — TYPE AND SCREEN
ABO/RH(D): AB POS
Antibody Screen: NEGATIVE
Unit division: 0
Unit division: 0

## 2021-05-07 LAB — BPAM RBC
Blood Product Expiration Date: 202301252359
Blood Product Expiration Date: 202301252359
Unit Type and Rh: 6200
Unit Type and Rh: 6200

## 2021-05-07 LAB — PREPARE RBC (CROSSMATCH)

## 2021-05-10 NOTE — Anesthesia Preprocedure Evaluation (Addendum)
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; EF 30-35%)  Normal cardiovascular exam Rhythm:Regular Rate:Normal     Neuro/Psych negative neurological ROS     GI/Hepatic Bowel perforation   Endo/Other  Obesity   Renal/GU CRFRenal disease     Musculoskeletal   Abdominal   Peds  Hematology negative hematology ROS (+)   Anesthesia Other Findings   Reproductive/Obstetrics                            Anesthesia Physical Anesthesia Plan  ASA: 4 and emergent  Anesthesia Plan: General   Post-op Pain Management:    Induction: Intravenous  PONV Risk Score and Plan: 2 and Ondansetron, Dexamethasone and Treatment may vary due to age or medical condition  Airway Management Planned: Oral ETT  Additional Equipment: Arterial line and Ultrasound Guidance Line Placement  Intra-op Plan:   Post-operative Plan: Extubation in OR and Possible Post-op intubation/ventilation  Informed Consent: I have reviewed the patients History and Physical, chart, labs and discussed the procedure including the risks, benefits and alternatives for the proposed anesthesia with the patient or authorized representative who has indicated his/her understanding and acceptance.     Dental advisory given  Plan Discussed with: CRNA  Anesthesia Plan Comments: (Very tenuous condition; pt warned of high risk of complications including cardiovascular  collapse and death.  Plan for pre-induction arterial line and post-induction central line.  Consented for postoperative TAP blocks if indicated.  Agrees to postoperative intubation if needed.  Patient consented for risks of anesthesia including but not limited to:  - adverse reactions to medications - damage to eyes, teeth, lips or other oral mucosa - nerve damage due to positioning  - sore throat or hoarseness - damage to heart, brain, nerves, lungs, other parts of body or loss of life  Informed patient about role of CRNA in peri- and intra-operative care.  Patient voiced understanding.)      Anesthesia Quick Evaluation

## 2021-05-10 NOTE — ED Triage Notes (Signed)
Pt c/o abd pain since Saturday, epigastric.with N/V/D .Marland Kitchen pt is here with son  n law.

## 2021-05-10 NOTE — ED Notes (Signed)
Male urine suction device applied. Warm blankets applied. Second bolus warm fluids used per MD verbal order

## 2021-05-10 NOTE — Death Summary Note (Signed)
DEATH SUMMARY   Patient Details  Name: Douglas Mccall MRN: [id number] DOB: 12/01/1945  Admission/Discharge Information   Admit Date:  2021/05/24  Date of Death: Date of Death: 2021-05-24  Time of Death: Time of Death: 04-Sep-1932  Length of Stay: 0  Referring Physician: Dorothey Baseman, MD   Reason(s) for Hospitalization  --Pneumoperitoneum --Diverticulitis of small bowel with perforation --Septic shock  Diagnoses  Preliminary cause of death: Severe sepsis with acute organ dysfunction (HCC) Secondary Diagnoses (including complications and co-morbidities):  Principal Problem:   Pneumoperitoneum Active Problems:   Diverticulitis of small intestine with perforation without abscess or bleeding   Brief Hospital Course (including significant findings, care, treatment, and services provided and events leading to death)  Douglas Mccall is a 76 y.o. year old male who presented to [facility] with 3 day history of worsening abdominal pain.  He was found to be markedly hypotensive, tachypneic, in AKI, with lactic acidosis, and imaging findings concerning for pneumoperitoneum with perforated small bowel diverticulitis.  He was taken emergently to the OR for exploratory laparotomy, small bowel resection with primary side to side anastomosis.  He required pressors in the OR and was transferred to ICU immediately post-op and remained intubated, sedated, with two pressors.  Shortly after arrival to ICU, patient had a CODE BLUE with PEA arrest requiring CPR and ACLS.  He had ROSC but shortly after had further arrest episodes.  He had point of care echo at bedside which showed markedly akinetic heart, highly suspicious for a myocardial infarction as a result of his severe sepsis and septic shock.  His daughter confirmed change in code status to DNR/DNI, and patient passed away, with time of death 21.    Pertinent Labs and Studies  Significant Diagnostic Studies CT ABDOMEN PELVIS WO CONTRAST  Addendum Date:  2021/05/24   ADDENDUM REPORT: 2021/05/24 12:34 ADDENDUM: Findings were discussed by telephone with Dr. Marisa Severin at 12:30 p.m., 2021-05-24. Electronically Signed   By: Jearld Lesch M.D.   On: 2021/05/24 12:34   Result Date: May 24, 2021 CLINICAL DATA:  Abdominal pain, free air identified by plain radiograph EXAM: CT ABDOMEN AND PELVIS WITHOUT CONTRAST TECHNIQUE: Multidetector CT imaging of the abdomen and pelvis was performed following the standard protocol without IV contrast. COMPARISON:  Same-day abdominal radiographs FINDINGS: Lower chest: No acute abnormality.  Cardiomegaly. Hepatobiliary: No solid liver abnormality is seen. No gallstones, gallbladder wall thickening, or biliary dilatation. Pancreas: Unremarkable. No pancreatic ductal dilatation or surrounding inflammatory changes. Spleen: Normal in size without significant abnormality. Adrenals/Urinary Tract: Adrenal glands are unremarkable. Kidneys are normal, without renal calculi, solid lesion, or hydronephrosis. Bladder is unremarkable. Stomach/Bowel: Stomach is within normal limits. Appendix appears normal. No evidence of bowel wall thickening, distention, or inflammatory changes. There is extensive small bowel diverticulosis with inflammatory fluid and fat stranding in the central small bowel mesentery (series 2, image 46). Sigmoid diverticulosis. Vascular/Lymphatic: Aortic atherosclerosis. No enlarged abdominal or pelvic lymph nodes. Reproductive: Prostatomegaly. Other: No abdominal wall hernia or abnormality. Trace free fluid in the small bowel mesentery and left lower quadrant. Moderate volume pneumoperitoneum throughout the abdomen and pelvis, including in the lesser peritoneal sac. Musculoskeletal: No acute or significant osseous findings. IMPRESSION: 1. Moderate volume pneumoperitoneum throughout the abdomen and pelvis, including in the lesser peritoneal sac, consistent with bowel perforation. 2. Extensive small bowel diverticulosis with  inflammatory fluid and fat stranding in the central small bowel mesentery. Suspect small bowel diverticulitis as nidus of perforation. 3. Prostatomegaly. Call report request was placed at the time of  interpretation, final communication will be documented. Aortic Atherosclerosis (ICD10-I70.0). Electronically Signed: By: Jearld Lesch M.D. On: May 21, 2021 12:24   DG ABD ACUTE 2+V W 1V CHEST  Result Date: 05-21-21 CLINICAL DATA:  Abdominal pain since Saturday. Nausea vomiting and diarrhea. History of CHF and hypertension. EXAM: DG ABDOMEN ACUTE WITH 1 VIEW CHEST COMPARISON:  06/11/2020 chest radiograph. FINDINGS: Frontal view of the chest demonstrates midline trachea. Mild cardiomegaly. No pleural effusion or pneumothorax. Low lung volumes with resultant pulmonary interstitial prominence. Scarring or subsegmental atelectasis at the lateral left lung base. Abdominal films demonstrate apparent free intraperitoneal air as evidenced by lucency under the right greater than left hemidiaphragms. No significant air-fluid levels on upright positioning of the upper abdomen. Supine views demonstrate no gaseous distention of bowel loops. Gas-filled, normal caliber small bowel loops are nonspecific. Phleboliths in the left hemipelvis. IMPRESSION: Free intraperitoneal air under the hemidiaphragms. Recommend further evaluation with CT. No bowel obstruction identified. Cardiomegaly with mild left lower lobe scarring. Critical test results telephoned to .Jacki Cones, rn. At the time of interpretation at 11:36 a.m.On May 21, 2021. Electronically Signed   By: Jeronimo Greaves M.D.   On: 05-21-21 11:37    Microbiology Recent Results (from the past 240 hour(s))  Resp Panel by RT-PCR (Flu A&B, Covid) Nasopharyngeal Swab     Status: None   Collection Time: 05/21/2021  1:18 PM   Specimen: Nasopharyngeal Swab; Nasopharyngeal(NP) swabs in vial transport medium  Result Value Ref Range Status   SARS Coronavirus 2 by RT PCR NEGATIVE NEGATIVE  Final    Comment: (NOTE) SARS-CoV-2 target nucleic acids are NOT DETECTED.  The SARS-CoV-2 RNA is generally detectable in upper respiratory specimens during the acute phase of infection. The lowest concentration of SARS-CoV-2 viral copies this assay can detect is 138 copies/mL. A negative result does not preclude SARS-Cov-2 infection and should not be used as the sole basis for treatment or other patient management decisions. A negative result may occur with  improper specimen collection/handling, submission of specimen other than nasopharyngeal swab, presence of viral mutation(s) within the areas targeted by this assay, and inadequate number of viral copies(<138 copies/mL). A negative result must be combined with clinical observations, patient history, and epidemiological information. The expected result is Negative.  Fact Sheet for Patients:  BloggerCourse.com  Fact Sheet for Healthcare Providers:  SeriousBroker.it  This test is no t yet approved or cleared by the [place] FDA and  has been authorized for detection and/or diagnosis of SARS-CoV-2 by FDA under an Emergency Use Authorization (EUA). This EUA will remain  in effect (meaning this test can be used) for the duration of the COVID-19 declaration under Section 564(b)(1) of the Act, 21 U.S.C.section 360bbb-3(b)(1), unless the authorization is terminated  or revoked sooner.       Influenza A by PCR NEGATIVE NEGATIVE Final   Influenza B by PCR NEGATIVE NEGATIVE Final    Comment: (NOTE) The Xpert Xpress SARS-CoV-2/FLU/RSV plus assay is intended as an aid in the diagnosis of influenza from Nasopharyngeal swab specimens and should not be used as a sole basis for treatment. Nasal washings and aspirates are unacceptable for Xpert Xpress SARS-CoV-2/FLU/RSV testing.  Fact Sheet for Patients: BloggerCourse.com  Fact Sheet for Healthcare  Providers: SeriousBroker.it  This test is not yet approved or cleared by the [place] FDA and has been authorized for detection and/or diagnosis of SARS-CoV-2 by FDA under an Emergency Use Authorization (EUA). This EUA will remain in effect (meaning this test can be used) for the  duration of the COVID-19 declaration under Section 564(b)(1) of the Act, 21 U.S.C. section 360bbb-3(b)(1), unless the authorization is terminated or revoked.  Performed at [facility], [address] [place], [place] [address]   MRSA Next Gen by PCR, Nasal     Status: None   Collection Time: 09-May-2021  5:36 PM   Specimen: Nasal Mucosa; Nasal Swab  Result Value Ref Range Status   MRSA by PCR Next Gen NOT DETECTED NOT DETECTED Final    Comment: (NOTE) The GeneXpert MRSA Assay (FDA approved for NASAL specimens only), is one component of a comprehensive MRSA colonization surveillance program. It is not intended to diagnose MRSA infection nor to guide or monitor treatment for MRSA infections. Test performance is not FDA approved in patients less than 80 years old. Performed at [facility], [address] [place], [place] [address]     Lab Basic Metabolic Panel: Recent Labs  Lab 05/09/21 1058 May 09, 2021 1855  NA 133* 135  K 4.3 4.7  CL 97* 99  CO2 22 19*  GLUCOSE 180* 379*  BUN 104* 94*  CREATININE 5.66* 4.82*  CALCIUM 8.8* 8.0*  PHOS  --  7.4*   Liver Function Tests: Recent Labs  Lab May 09, 2021 1058 05/09/2021 1855  AST 49*  --   ALT 30  --   ALKPHOS 83  --   BILITOT 3.5*  --   PROT 7.1  --   ALBUMIN 2.7* 1.8*   Recent Labs  Lab 05/09/21 1058  LIPASE 28   No results for input(s): AMMONIA in the last 168 hours. CBC: Recent Labs  Lab 05/09/2021 1058 May 09, 2021 1855  WBC 9.2 7.6  NEUTROABS 8.0* 5.5  HGB 14.6 10.8*  HCT 42.4 32.4*  MCV 90.4 94.5  PLT 277 167   Cardiac Enzymes: No results for input(s): CKTOTAL, CKMB, CKMBINDEX,  TROPONINI in the last 168 hours. Sepsis Labs: Recent Labs  Lab 2021/05/09 1058 09-May-2021 1246 05/09/21 1855  PROCALCITON  --   --  51.95  WBC 9.2  --  7.6  LATICACIDVEN 3.5* 2.0*  --     Procedures/Operations  --Exploratory Laparotomy with small bowel resection and primary side to side stapled anastomosis --Arterial line placement --Central line placement   [facility] Carmelina Balducci May 09, 2021, 10:57 PM

## 2021-05-10 NOTE — Progress Notes (Signed)
I responded to a Code Blue alert and visited the patient's room to provide support as needed. No family was present. I shared with the staff that the Chaplain is available for support as needed or requested.    05-25-21 1800  Clinical Encounter Type  Visited With Patient  Visit Type Code;Spiritual support  Referral From Nurse  Consult/Referral To Chaplain  Spiritual Encounters  Spiritual Needs Prayer     Chaplain Dr Melvyn Novas

## 2021-05-10 NOTE — Anesthesia Procedure Notes (Signed)
Procedure Name: Intubation Date/Time: May 05, 2021 2:10 PM Performed by: Ginger Carne, CRNA Pre-anesthesia Checklist: Patient identified, Emergency Drugs available, Suction available, Patient being monitored and Timeout performed Patient Re-evaluated:Patient Re-evaluated prior to induction Oxygen Delivery Method: Circle system utilized Preoxygenation: Pre-oxygenation with 100% oxygen Induction Type: IV induction Ventilation: Mask ventilation without difficulty Laryngoscope Size: McGraph and 3 Grade View: Grade I Tube type: Oral Tube size: 7.5 mm Number of attempts: 1 Airway Equipment and Method: Stylet and Video-laryngoscopy Placement Confirmation: ETT inserted through vocal cords under direct vision, positive ETCO2 and breath sounds checked- equal and bilateral Secured at: 18 cm Tube secured with: Tape Dental Injury: Teeth and Oropharynx as per pre-operative assessment

## 2021-05-10 NOTE — Op Note (Signed)
Procedure Date:  30-May-2021  Pre-operative Diagnosis:  Pneumoperitoneum  Post-operative Diagnosis: Perforated small bowel diverticulitis  Procedure:  Exploratory Laparotomy, small bowel resection with side to side anastomosis  Surgeon:  Howie Ill, MD  Assistant:  Lynden Oxford, PA-C.  His assistance was critical due to the complexity of the case, the need for appropriate exposure and assistance with anastomosis.  Anesthesia:  General endotracheal  Estimated Blood Loss:  25 ml  Specimens:  Small bowel, 30 cm.  Complications:  None  Indications for Procedure:  This is a 76 y.o. male who presents with abdominal pain and peritonitis and workup revealing pneumoperitoneum.  The risks of bleeding, abscess or infection, injury to surrounding structures, and need for further procedures were all discussed with the patient and was willing to proceed.  Description of Procedure: The patient was correctly identified in the preoperative area and brought into the operating room.  The patient was placed supine with VTE prophylaxis in place.  Appropriate time-outs were performed.  Anesthesia was induced and the patient was intubated.  Foley catheter was placed.  Appropriate antibiotics were infused.  The patient had a left radial arterial line and a right IJ central line placed by Dr. Ronni Rumble.  Please see her procedure note for further details.  The abdomen was prepped and draped in a sterile fashion.  A midline incision was made and electrocautery was used to dissect down the subcutaneous tissue to the fascia.  The fascia was incised and extended superiorly and inferiorly.  Upon entering the abdomen (organ space), I encountered purulence in the abdominal cavity in all quadrants, in addition to succus throughout.  The abdomen was explored revealing a small perforation in the proximal small bowel consistent with a perforated small bowel diverticulitis.  The stomach was evaluated and there was no  perforation.  NG tube was placed and confirmed to be within the stomach.  The small bowel was run from Ligament of Treitz to the ileocecal valve and there were no other perforations.  The colon was evaluated as well and there was no perforation or areas of thickness.  With that done, then we turned our attention to the perforation itself.  The surrounding small bowel was very inflamed and thickened and as such we picked areas both proximal and distal that were about 15 cm apart from the perforation in order to make our resection and anastomosis.  We created windows in the mesentery both proximally and distally and used a GIA blue load 75 mm stapler to come across the small bowel proximally and distally.  Then we used the Enseal device to take down the mesentery of the small bowel.  The specimen was then sent off to pathology for further analysis.  The 2 ends of the small bowel were then placed in a side-to-side fashion using two 3-0 silk sutures to keep them well aligned.  Enterotomies were made in both staple lines and another GIA 75 mm blue load was used to fire across creating a common channel.  Another load was used to close the common channel at the enterotomy sites.  3-0 silk sutures were then used to imbricate the staple line to further protected as well as 2 different areas anterior and posteriorly at the anastomosis as a precaution.  We then used another 3-0 silk suture to close the mesenteric defect created.  Then the abdominal cavity was thoroughly washed with 4 L of warm normal saline until more clear fluid return.  We then placed a 19  [place] Blake drain in the left lower quadrant going towards the pelvis and 19 [place] Blake drain in the right lower quadrant going towards the mid abdomen at the location of the anastomosis.  60 mL of Exparel solution combined with 0.5% bupivacaine with epi and saline were then infiltrated onto the peritoneum, fascia, and subcutaneous tissue of the incision.  The fascia  was then closed using #1 PDS sutures.  The midline wound was irrigated and the subcutaneous space was left open.  The drains were secured using 3-0 nylon suture.  The wound was dressed with wet-to-dry dressing and the drains with 4x4 gauze and tegaderm.   The patient was then transported to the intensive care unit intubated and sedated, requiring 2 pressors for blood pressure control, for further resuscitation and management.  The patient tolerated the procedure well and all counts were correct at the end of the case.   Howie Ill, MD

## 2021-05-10 NOTE — Consult Note (Signed)
NAME:  Douglas Mccall, MRN:  [id number], DOB:  11/24/1945, LOS: 0 ADMISSION DATE:  17-May-2021, CONSULTATION DATE:  2021-05-17 REFERRING MD:  Dr. Hampton Abbot, CHIEF COMPLAINT:  Abdominal Pain   Brief Pt Description / Synopsis:  76 y.o. Male admitted with Septic shock due to Peritonitis in setting of perforated viscus (suspect small bowel diverticulitis) requiring emergent exploratory laparotomy.  Returns to ICU post procedure and remains intubated.   History of Present Illness:  Douglas Mccall is a 76 y.o. 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  Pt is currently intubated and sedated, therefore history is obtained by chart review.   Per notes, he began having upper abdominal pain on 05/01/21 of which he initially he felt was related to food poisoning.  Also report associated nausea.  However the pain continued to worsen and become severe prompting him to seek medical attention.  He denied chest pain, hematemesis/hematochezia/melena.  ED Course: Initial vital signs: Temperature 94.5 F axillary, respiratory rate 26, pulse 90, blood pressure 55/43, SPO2 97% on room air Significant labs: Sodium 133, chloride 97, bicarbonate 22, glucose 180, BUN 104, creatinine 5.66, anion gap 14, albumin 2.7, AST 49, ALT 30, total bilirubin 3.5, BNP 95.7, high-sensitivity troponin 28, lactic acid 3.5, WBC 9.2 with neutrophilia and mild left shift COVID-19 and influenza PCR negative Imaging: Abdominal Xray which showed free air under diaphragm, and this was followed by stat CT Abdomen/Pelvis which confirmed free air, with possible source being small bowel diverticulitis Medications received: 3 L of normal saline boluses, IV Zosyn, IV Zofran 4 mg, Levophed drip initiated due to persistent hypotension  General surgery was consulted and patient is being taken to the operating room for emergent  exploratory laparotomy.  Plan is for the patient to return to ICU post procedure and remain intubated due to septic shock and multiple metabolic derangements.  PCCM consulted for further management.  Pertinent  Medical History  HFrEF (LVEF 30-35%) Hypertension  Micro Data:  05/17/21: SARS-CoV-2 PCR>>negative 2021-05-17: Blood culture x2>>  Antimicrobials:  Zosyn 12/26>>  Significant Hospital Events: Including procedures, antibiotic start and stop dates in addition to other pertinent events   05-17-21: Presents to ER with abdominal pain.  Found to have pneumoperitoneum and perforated viscus.  Taken for emergent exploratory laparotomy.  Returns to ICU post procedure, remains intubated, PCCM consulted  Interim History / Subjective:  -Presented to ED  with abdominal pain -Found to have pneumoperitoneum and perforated viscus.   -Required Levophed in ED -Taken for emergent exploratory laparotomy.   -Per surgery, pt will remain intubated post procedure ~ PCCM consulted  Objective   Blood pressure (!) 66/28, pulse 76, temperature 98.5 F (36.9 C), temperature source Oral, resp. rate (!) 27, height _0  (1.803 m), weight 115.3 kg, SpO2 (!) 68 %.        Intake/Output Summary (Last 24 hours) at 17-May-2021 1440 Last data filed at May 17, 2021 1353 Gross per 24 hour  Intake 2147.55 ml  Output --  Net 2147.55 ml   Filed Weights   05-17-21 1030  Weight: 110.2 kg    Examination: General: Critically ill-appearing male intubated, sedated, mottled appearance. HEENT: Orotracheally intubated, trachea midline. Lungs: Coarse breath sounds throughout.  No wheezes or rhonchi noted.  Synchronous with the ventilator. Cardiovascular: Sinus rhythm, distant heart tones.  Defibrillator pads in place. Abdomen: Obese, distended, wound covered with dressing.  JP  drains at right upper quadrant and left lower quadrant.  Soft.  No bowel sounds present. Extremities: Cool extremities, mottled. Neuro:  Unresponsive, occasionally would lift hands off the bed. GU: Foley in place draining clear urine.  Resolved Hospital Problem list     Assessment & Plan:   Septic Shock Mildly elevated Troponin, suspect demand ischemia PMHx of HFrEF (LVEF 30-35% on Echo 06/13/20) -Continuous cardiac monitoring -Maintain MAP >65 -IV fluids (received 3L NS bolus in ED) -Obtain CVP to guide further fluid resuscitation -Vasopressors as needed to maintain MAP goal -Trend lactic acid until normalized (3.5 ~ 2.0 ) -Trend HS Troponin until peaked (28 ~ 24) -Consider Echocardiogram   Severe Sepsis due to Peritonitis in the setting of Perforated Viscus (suspect small bowel diverticulitis) -General Surgery following -S/p exploratory Laparotomy on 12/26 -Monitor fever curve -Trend WBC's & Procalcitonin -Follow cultures as above -Continue empiric Zosyn pending cultures & sensitivities  Post-op Respiratory Failure due to Severe Sepsis and multiple metabolic derangements -Full vent support, implement lung protective strategies -Plateau pressures less than 30 cm H20 -Wean FiO2 & PEEP as tolerated to maintain O2 sats >92% -Follow intermittent Chest X-ray & ABG as needed -Spontaneous Breathing Trials when respiratory parameters met and mental status permits -Implement VAP Bundle -Prn Bronchodilators  Acute Kidney Injury Mild Hyponatremia -Monitor I&O's / urinary output -Follow BMP -Ensure adequate renal perfusion -Avoid nephrotoxic agents as able -Replace electrolytes as indicated -IV fluids  Sedation needs in the setting of mechanical ventilation -Maintain a RASS goal of 0 to -1 -Fentanyl and Propofol as needed to maintain RASS goal -Avoid sedating medications as able -Daily wake up assessment     Pt is critically ill, prognosis is guarded.  High risk for cardiac arrest and death.   Best Practice (right click and "Reselect all SmartList Selections" daily)   Diet/type: NPO DVT prophylaxis:  SCD GI prophylaxis: PPI Lines: N/A Foley:  Yes, and it is still needed Code Status:  full code Last date of multidisciplinary goals of care discussion [N/A]  Labs   CBC: Recent Labs  Lab 05-20-21 1058  WBC 9.2  NEUTROABS 8.0*  HGB 14.6  HCT 42.4  MCV 90.4  PLT 616    Basic Metabolic Panel: Recent Labs  Lab 05/20/21 1058  NA 133*  K 4.3  CL 97*  CO2 22  GLUCOSE 180*  BUN 104*  CREATININE 5.66*  CALCIUM 8.8*   GFR: Estimated Creatinine Clearance: 14.5 mL/min (A) (by C-G formula based on SCr of 5.66 mg/dL (H)). Recent Labs  Lab 2021-05-20 1058 05/20/21 1246  WBC 9.2  --   LATICACIDVEN 3.5* 2.0*    Liver Function Tests: Recent Labs  Lab 05/20/21 1058  AST 49*  ALT 30  ALKPHOS 83  BILITOT 3.5*  PROT 7.1  ALBUMIN 2.7*   Recent Labs  Lab 05/20/2021 1058  LIPASE 28   No results for input(s): AMMONIA in the last 168 hours.  ABG No results found for: PHART, PCO2ART, PO2ART, HCO3, TCO2, ACIDBASEDEF, O2SAT   Coagulation Profile: No results for input(s): INR, PROTIME in the last 168 hours.  Cardiac Enzymes: No results for input(s): CKTOTAL, CKMB, CKMBINDEX, TROPONINI in the last 168 hours.  HbA1C: Hgb A1c MFr Bld  Date/Time Value Ref Range Status  06/12/2020 09:41 AM 5.3 4.8 - 5.6 % Final    Comment:    (NOTE) Pre diabetes:          5.7%-6.4%  Diabetes:              >  6.4%  Glycemic control for   <7.0% adults with diabetes     CBG: No results for input(s): GLUCAP in the last 168 hours.  Review of Systems:   Unable to assess due to intubation/sedation, and critical illness   Past Medical History:  He,  has a past medical history of CHF (congestive heart failure) ([place]) and Hypertension.   Surgical History:  History reviewed. No pertinent surgical history.   Social History:   reports that he has never smoked. He has never used smokeless tobacco. He reports that he does not currently use alcohol. He reports that he does not use drugs.    Family History:  His family history includes Heart disease in his father and mother.   Allergies No Known Allergies   Home Medications  Prior to Admission medications   Medication Sig Start Date End Date Taking? Authorizing Provider  carvedilol (COREG) 3.125 MG tablet Take 3.125 mg by mouth 2 (two) times daily with a meal. Patient not taking: Reported on 05-19-2021    [provider]  carvedilol (COREG) 6.25 MG tablet Take 6.25 mg by mouth 2 (two) times daily. 04/22/21   [provider]  dapagliflozin propanediol (FARXIGA) 10 MG TABS tablet Take 10 mg by mouth daily. Patient not taking: Reported on 05-19-21    [provider]  ENTRESTO 24-26 MG Take 1 tablet by mouth 2 (two) times daily. 04/22/21   [provider]  furosemide (LASIX) 20 MG tablet Take 1 tablet (20 mg total) by mouth daily. 06/14/20   Wouk, Ailene Rud, MD  potassium chloride (KLOR-CON) 8 MEQ tablet Take 8 mEq by mouth every morning. 02/21/21   [provider]  potassium chloride SA (KLOR-CON) 20 MEQ tablet Take 1 tablet (20 mEq total) by mouth daily. Patient not taking: Reported on 2021-05-19 06/13/20   Gwynne Edinger, MD  rosuvastatin (CRESTOR) 20 MG tablet Take 20 mg by mouth daily. Patient not taking: Reported on 05/19/2021    [provider]  sacubitril-valsartan (ENTRESTO) 49-51 MG Take 1 tablet by mouth 2 (two) times daily. Patient not taking: Reported on May 19, 2021    [provider]     Critical care time: 75 minutes    As patient was being evaluated, subsequently became bradycardic and lost blood pressure despite pressors infusing.  CODE BLUE called.  Please see separate documentation.   Renold Don, MD Advanced Bronchoscopy PCCM [facility]

## 2021-05-10 NOTE — Anesthesia Procedure Notes (Signed)
Arterial Line Insertion Start/End2023/01/19 2:28 PM, 2021-05-28 2:35 PM Performed by: Reed Breech, MD, anesthesiologist  Patient location: OR. Preanesthetic checklist: patient identified, IV checked, site marked, risks and benefits discussed, surgical consent, monitors and equipment checked, pre-op evaluation, timeout performed and anesthesia consent Patient sedated Left, radial was placed Catheter size: 20 G Hand hygiene performed  and Seldinger technique used  Attempts: 1 Procedure performed without using ultrasound guided technique. Following insertion, dressing applied and Biopatch. Post procedure assessment: normal and unchanged  Patient tolerated the procedure well with no immediate complications.

## 2021-05-10 NOTE — Progress Notes (Signed)
Patient admitted to ICU. Patient became bradycardic and hypotensive. Pulse not palpable Dr. Jayme Cloud at bedside and code called/CPR initiated. ACLS drugs initiated.  Daughter called regarding patients status. Surgeon at bedside [place] called. Report given to on coming RN.

## 2021-05-10 NOTE — Significant Event (Signed)
Received shift report from dayshift RN, at the time of shift change pt was coding, daughter had arrived at beside and Dr. Jayme Cloud spoke with her about making the pt a DNR. Pt passed away at 1934, daughter was at bedside the entire time and left shortly after the patient passed. Belongings were left and stored on unit, daughter was called and will pick them up in the morning. Belongings include watch, medical bracelet, socks and hearing aides.

## 2021-05-10 NOTE — ED Provider Notes (Signed)
[facility] Emergency Department Provider Note   ____________________________________________   Event Date/Time   First MD Initiated Contact with Patient May 08, 2021 1056     (approximate)  I have reviewed the triage vital signs and the nursing notes.   HISTORY  Chief Complaint Abdominal Pain (/)    HPI Douglas Mccall is a 76 y.o. male who comes in complaining of severe diffuse abdominal pain had gotten sick on Friday with vomiting up some black material this is happened several times since then.  Patient's belly pain gotten worse and worse and hurts anytime he moves.  He was not running a fever.  He was tachycardic.  Blood pressure on arrival was 44 systolic.  Patient was rapidly brought back to the room and 2 large-bore IVs were started and he was begin giving fluids.  Labs were drawn.  Bedside ultrasound was attempted but due to gas I could not see the aorta or much of anything else.  He did not have any fluid in the abdomen on FAST exam.  Rectal was done which was Hemoccult positive but H&H came back showing no anemia and no white count.  Patient's blood pressure came up enough for [place] to get a CT which showed what appeared to be perforated small bowel diverticulum.  The plain films that we had done had showed free air.  I called the surgeon as soon as we saw the free air.  Patient had gotten 3 L of normal saline and then we started Levophed titrated upwards.  Have to be very careful as he had dilated cardiomyopathy and a history of CHF.  Lab reported his creatinine was 5 therefore we had to do a CT without contrast.  I did go to CT with the patient to monitor his condition.      Past Medical History:  Diagnosis Date   CHF (congestive heart failure) (HCC)    Hypertension     Patient Active Problem List   Diagnosis Date Noted   Acute CHF (congestive heart failure) (HCC) 06/11/2020    History reviewed. No pertinent surgical history.  Prior to Admission  medications   Medication Sig Start Date End Date Taking? Authorizing Provider  carvedilol (COREG) 3.125 MG tablet Take 3.125 mg by mouth 2 (two) times daily with a meal. Patient not taking: Reported on May 08, 2021    [provider]  carvedilol (COREG) 6.25 MG tablet Take 6.25 mg by mouth 2 (two) times daily. 04/22/21   [provider]  dapagliflozin propanediol (FARXIGA) 10 MG TABS tablet Take 10 mg by mouth daily. Patient not taking: Reported on 08-May-2021    [provider]  ENTRESTO 24-26 MG Take 1 tablet by mouth 2 (two) times daily. 04/22/21   [provider]  furosemide (LASIX) 20 MG tablet Take 1 tablet (20 mg total) by mouth daily. 06/14/20   Wouk, Wilfred Curtis, MD  potassium chloride (KLOR-CON) 8 MEQ tablet Take 8 mEq by mouth every morning. 02/21/21   [provider]  potassium chloride SA (KLOR-CON) 20 MEQ tablet Take 1 tablet (20 mEq total) by mouth daily. Patient not taking: Reported on 2021/05/08 06/13/20   Kathrynn Running, MD  rosuvastatin (CRESTOR) 20 MG tablet Take 20 mg by mouth daily. Patient not taking: Reported on 08-May-2021    [provider]  sacubitril-valsartan (ENTRESTO) 49-51 MG Take 1 tablet by mouth 2 (two) times daily. Patient not taking: Reported on 05/08/2021    [provider]    Allergies  Patient has no known allergies.  Family History  Problem Relation Age of Onset   Heart disease Mother    Heart disease Father     Social History Social History   Tobacco Use   Smoking status: Never   Smokeless tobacco: Never  Substance Use Topics   Alcohol use: Not Currently   Drug use: Never    Review of Systems  Constitutional: No fever/chills Eyes: No visual changes. ENT: No sore throat. Cardiovascular: Denies chest pain. Respiratory: Denies shortness of breath. Gastrointestinal: Severe diffuse abdominal pain.   nausea,  vomiting.  No diarrhea.  No constipation. Genitourinary: Negative for  dysuria. Musculoskeletal: Negative for back pain. Skin: Negative for rash. Neurological: Negative for headaches, focal weakness   ____________________________________________   PHYSICAL EXAM:  VITAL SIGNS: ED Triage Vitals  Enc Vitals Group     BP 15-May-2021 1045 (!) 45/22     Pulse Rate 15-May-2021 1037 90     Resp 2021-05-15 1105 (!) 40     Temp 05-15-2021 1045 (!) 94.5 F (34.7 C)     Temp Source May 15, 2021 1045 Axillary     SpO2 May 15, 2021 1042 97 %     Weight 05/15/21 1030 242 lb 15.2 oz (110.2 kg)     Height May 15, 2021 1030 5\' 11"  (1.803 m)     Head Circumference --      Peak Flow --      Pain Score --      Pain Loc --      Pain Edu? --      Excl. in GC? --     Constitutional: Alert and oriented.  Pale and in pain Eyes: Conjunctivae are pale PER. EOMI. Head: Atraumatic. Nose: No congestion/rhinnorhea. Mouth/Throat: Mucous membranes are slightly dry initially and pale oropharynx non-erythematous. Neck: No stridor.   Cardiovascular: Normal rate, regular rhythm. Grossly normal heart sounds.  Good peripheral circulation. Respiratory: Normal respiratory effort.  No retractions. Lungs CTAB. Gastrointestinal: Soft markedly tender diffusely  distention. No abdominal bruits.  Rectal: No masses are palpated stool is brown but Hemoccult positive  \musculoskeletal: No lower extremity tenderness nor edema.  Neurologic:  Normal speech and language. No gross focal neurologic deficits are appreciated. Skin:  Skin is warm, dry and intact. No rash noted.   ____________________________________________   LABS (all labs ordered are listed, but only abnormal results are displayed)  Labs Reviewed  COMPREHENSIVE METABOLIC PANEL - Abnormal; Notable for the following components:      Result Value   Sodium 133 (*)    Chloride 97 (*)    Glucose, Bld 180 (*)    BUN 104 (*)    Creatinine, Ser 5.66 (*)    Calcium 8.8 (*)    Albumin 2.7 (*)    AST 49 (*)    Total Bilirubin 3.5 (*)    GFR,  Estimated 10 (*)    All other components within normal limits  LACTIC ACID, PLASMA - Abnormal; Notable for the following components:   Lactic Acid, Venous 3.5 (*)    All other components within normal limits  LACTIC ACID, PLASMA - Abnormal; Notable for the following components:   Lactic Acid, Venous 2.0 (*)    All other components within normal limits  CBC WITH DIFFERENTIAL/PLATELET - Abnormal; Notable for the following components:   Neutro Abs 8.0 (*)    Lymphs Abs 0.4 (*)    Abs Immature Granulocytes 0.41 (*)    All other components within normal limits  TROPONIN I (HIGH SENSITIVITY) -  Abnormal; Notable for the following components:   Troponin I (High Sensitivity) 28 (*)    All other components within normal limits  BRAIN NATRIURETIC PEPTIDE  LIPASE, BLOOD  URINALYSIS, ROUTINE W REFLEX MICROSCOPIC  PREPARE RBC (CROSSMATCH)  TYPE AND SCREEN  ABO/RH  TROPONIN I (HIGH SENSITIVITY)   ____________________________________________  EKG  EKG read interpreted by me shows normal sinus rhythm rate of 87 normal axis some mild nonspecific ST-T wave changes multiple PVCs ____________________________________________  RADIOLOGY Jill Poling, personally viewed and evaluated these images (plain radiographs) as part of my medical decision making, as well as reviewing the written report by the radiologist.  ED MD interpretation: Plain films read by radiology reviewed by me almost simultaneously.  It does show free air on the upright chest not on the upright belly. CT read by radiology reviewed by me shows some free air also which is thought to be probably from a small bowel diverticulitis perforation   Official radiology report(s): CT ABDOMEN PELVIS WO CONTRAST  Addendum Date: 05-13-2021   ADDENDUM REPORT: May 13, 2021 12:34 ADDENDUM: Findings were discussed by telephone with Dr. Marisa Severin at 12:30 p.m., 05-13-2021. Electronically Signed   By: Jearld Lesch M.D.   On: 05-13-2021 12:34    Result Date: 05/13/21 CLINICAL DATA:  Abdominal pain, free air identified by plain radiograph EXAM: CT ABDOMEN AND PELVIS WITHOUT CONTRAST TECHNIQUE: Multidetector CT imaging of the abdomen and pelvis was performed following the standard protocol without IV contrast. COMPARISON:  Same-day abdominal radiographs FINDINGS: Lower chest: No acute abnormality.  Cardiomegaly. Hepatobiliary: No solid liver abnormality is seen. No gallstones, gallbladder wall thickening, or biliary dilatation. Pancreas: Unremarkable. No pancreatic ductal dilatation or surrounding inflammatory changes. Spleen: Normal in size without significant abnormality. Adrenals/Urinary Tract: Adrenal glands are unremarkable. Kidneys are normal, without renal calculi, solid lesion, or hydronephrosis. Bladder is unremarkable. Stomach/Bowel: Stomach is within normal limits. Appendix appears normal. No evidence of bowel wall thickening, distention, or inflammatory changes. There is extensive small bowel diverticulosis with inflammatory fluid and fat stranding in the central small bowel mesentery (series 2, image 46). Sigmoid diverticulosis. Vascular/Lymphatic: Aortic atherosclerosis. No enlarged abdominal or pelvic lymph nodes. Reproductive: Prostatomegaly. Other: No abdominal wall hernia or abnormality. Trace free fluid in the small bowel mesentery and left lower quadrant. Moderate volume pneumoperitoneum throughout the abdomen and pelvis, including in the lesser peritoneal sac. Musculoskeletal: No acute or significant osseous findings. IMPRESSION: 1. Moderate volume pneumoperitoneum throughout the abdomen and pelvis, including in the lesser peritoneal sac, consistent with bowel perforation. 2. Extensive small bowel diverticulosis with inflammatory fluid and fat stranding in the central small bowel mesentery. Suspect small bowel diverticulitis as nidus of perforation. 3. Prostatomegaly. Call report request was placed at the time of interpretation,  final communication will be documented. Aortic Atherosclerosis (ICD10-I70.0). Electronically Signed: By: Jearld Lesch M.D. On: 05-13-2021 12:24   DG ABD ACUTE 2+V W 1V CHEST  Result Date: May 13, 2021 CLINICAL DATA:  Abdominal pain since Saturday. Nausea vomiting and diarrhea. History of CHF and hypertension. EXAM: DG ABDOMEN ACUTE WITH 1 VIEW CHEST COMPARISON:  06/11/2020 chest radiograph. FINDINGS: Frontal view of the chest demonstrates midline trachea. Mild cardiomegaly. No pleural effusion or pneumothorax. Low lung volumes with resultant pulmonary interstitial prominence. Scarring or subsegmental atelectasis at the lateral left lung base. Abdominal films demonstrate apparent free intraperitoneal air as evidenced by lucency under the right greater than left hemidiaphragms. No significant air-fluid levels on upright positioning of the upper abdomen. Supine views demonstrate no gaseous distention  of bowel loops. Gas-filled, normal caliber small bowel loops are nonspecific. Phleboliths in the left hemipelvis. IMPRESSION: Free intraperitoneal air under the hemidiaphragms. Recommend further evaluation with CT. No bowel obstruction identified. Cardiomegaly with mild left lower lobe scarring. Critical test results telephoned to .Jacki Cones, rn. At the time of interpretation at 11:36 a.m.On 01-Jun-2021. Electronically Signed   By: Jeronimo Greaves M.D.   On: 01-Jun-2021 11:37    ____________________________________________   PROCEDURES  Procedure(s) performed (including Critical Care): Critical care time 45 minutes.  This includes a lot of time spent at the patient's bedside and then a code came in and I had to directed nurses care verbally from the bedside of the other patient.  I also did go to CT with this patient supervised his care hands-on.  I maintained pressure on the IV bag to make sure he got his bolus while he was going down the hallway to get the CT.  I reviewed the patient's old records spoke with the  surgeon.  Procedures   ____________________________________________   INITIAL IMPRESSION / ASSESSMENT AND PLAN / ED COURSE   On patient's arrival due to his pain initially I was concerned about rupturing abdominal aortic aneurysm which is the main reason I try to get the abdominal ultrasound of course I could not see anything due to bowel gas I was able to see some peristalsis and material moving in the intestines.  FAST exam was negative.  Plain films were then ordered as the patient was too unstable to go to CT and that did show free air.  I was thinking initially that this would be a perforated gastric ulcer but it turned out to be a perforated diverticulum most likely on the patient CT when he was stable enough to get it.  We did have to start Levophed when he got back as his pressure began dropping again.  Patient did have a GFR 48 in February of this year but now it is 79 he is obviously got AKI as well possibly due to dehydration.             ____________________________________________   FINAL CLINICAL IMPRESSION(S) / ED DIAGNOSES  Final diagnoses:  Generalized abdominal pain  Perforated abdominal viscus  Other specified hypotension  AKI (acute kidney injury) [facility])     ED Discharge Orders     None        Note:  This document was prepared using Dragon voice recognition software and may include unintentional dictation errors.    Arnaldo Natal, MD 06/01/21 1310

## 2021-05-10 NOTE — ED Notes (Signed)
Pt transported to ct by this RN and Juliette Alcide MD. Pt back in room. Placed on 2L [place] due to decreased oxygen saturation.

## 2021-05-10 NOTE — H&P (Addendum)
Date of Admission:  06/01/2021  Reason for Admission:  Pneumoperitoneum  History of Present Illness: Douglas Mccall is a 76 y.o. male presenting with a 3-day history of worsening abdominal pain.  Patient reports that he started having upper abdominal pain on 05/01/21, and initially thought could be related to food poisoning.  He had nausea and did try to induce emesis to help.  The pain continued on 12/24 and has been worsening since.  He reports very severe pain now, and on initial evaluation, he is also tachypneic and hypotensive.  Denies fevers or chills, chest pain, report that the pain had spread towards his shoulders, denies diarrhea or blood in the stool.  He presented to the ER today for further evaluation.  He has taken Aleve for the pain recently but does not take NSAIDs on a regular basis.  He does have significant history for CHF and cardiomyopathy with EF of 30-35% on echo done on 06/13/20.  Denies any prior abdominal surgeries.  In the ER, his WBC is normal at 9.2 with high neutrophil count, lactic acid of 3.5 which has improved to 2.0 after 2-3L of IV fluids, in AKI with Cr 5.66 up from 1.5 earlier this year, mild hyponatremia and hypochloremia, and elevated total bili of 3.5 though possibly from dehydration.  His initial blood pressure was quite markedly low, with initial BP of 55/43.  He had CXR which showed free air under diaphragm, and this was followed by stat CT scan which confirmed free air, with possible source being small bowel diverticulitis.  I have personally viewed the images and agree with the findings.  He has received 2-3 L of fluid boluses and is being started on Levophed.    Past Medical History: Past Medical History:  Diagnosis Date   CHF (congestive heart failure) (HCC)    Hypertension      Past Surgical History: History reviewed. No pertinent surgical history.  Home Medications: Prior to Admission medications   Medication Sig Start Date End Date Taking?  Authorizing Provider  carvedilol (COREG) 3.125 MG tablet Take 3.125 mg by mouth 2 (two) times daily with a meal. Patient not taking: Reported on 06/01/21    [provider]  carvedilol (COREG) 6.25 MG tablet Take 6.25 mg by mouth 2 (two) times daily. 04/22/21   [provider]  dapagliflozin propanediol (FARXIGA) 10 MG TABS tablet Take 10 mg by mouth daily. Patient not taking: Reported on 06-01-2021    [provider]  ENTRESTO 24-26 MG Take 1 tablet by mouth 2 (two) times daily. 04/22/21   [provider]  furosemide (LASIX) 20 MG tablet Take 1 tablet (20 mg total) by mouth daily. 06/14/20   Wouk, Wilfred Curtis, MD  potassium chloride (KLOR-CON) 8 MEQ tablet Take 8 mEq by mouth every morning. 02/21/21   [provider]  potassium chloride SA (KLOR-CON) 20 MEQ tablet Take 1 tablet (20 mEq total) by mouth daily. Patient not taking: Reported on 2021/06/01 06/13/20   Kathrynn Running, MD  rosuvastatin (CRESTOR) 20 MG tablet Take 20 mg by mouth daily. Patient not taking: Reported on 06/01/21    [provider]  sacubitril-valsartan (ENTRESTO) 49-51 MG Take 1 tablet by mouth 2 (two) times daily. Patient not taking: Reported on 2021-06-01    [provider]    Allergies: No Known Allergies  Social History:  reports that he has never smoked. He has never used smokeless tobacco. He reports that he does not currently use alcohol. He reports  that he does not use drugs.   Family History: Family History  Problem Relation Age of Onset   Heart disease Mother    Heart disease Father     Review of Systems: Review of Systems  Constitutional:  Negative for chills and fever.  HENT:  Negative for hearing loss.   Respiratory:  Positive for shortness of breath.   Cardiovascular:  Negative for chest pain.  Gastrointestinal:  Positive for abdominal pain, nausea and vomiting. Negative for constipation and diarrhea.  Genitourinary:  Negative  for dysuria.  Musculoskeletal:  Negative for myalgias.  Skin:  Negative for rash.  Neurological:  Negative for dizziness.  Psychiatric/Behavioral:  Negative for depression.    Physical Exam BP (!) 91/56    Pulse (!) 32    Temp (!) 96.2 F (35.7 C) (Rectal)    Resp (!) 33    Ht  (1.803 m)    Wt 110.2 kg    SpO2 93%    BMI 33.88 kg/m  CONSTITUTIONAL: In respiratory and pain distress. HEENT:  Normocephalic, atraumatic, extraocular motion intact. NECK: Trachea is midline, and there is no jugular venous distension.  RESPIRATORY:  Patient is tachypneic, using accessory respiratory muscles. CARDIOVASCULAR: Patient is hypotensive and bradycardic but sinus. GI: The abdomen is soft, distended, obese, with peritonitis and tenderness to palpation mostly in the upper abdomen. Has umbilical hernia likely containing fat. MUSCULOSKELETAL:  Normal muscle strength and tone in all four extremities.  No peripheral edema or cyanosis. SKIN: Skin turgor is normal. There are no pathologic skin lesions.  NEUROLOGIC:  Motor and sensation is grossly normal.  Cranial nerves are grossly intact. PSYCH:  Alert and oriented to person, place and time. Affect is normal.  Laboratory Analysis: Results for orders placed or performed during the hospital encounter of 05-22-2021 (from the past 24 hour(s))  Comprehensive metabolic panel     Status: Abnormal   Collection Time: May 22, 2021 10:58 AM  Result Value Ref Range   Sodium 133 (L) 135 - 145 mmol/L   Potassium 4.3 3.5 - 5.1 mmol/L   Chloride 97 (L) 98 - 111 mmol/L   CO2 22 22 - 32 mmol/L   Glucose, Bld 180 (H) 70 - 99 mg/dL   BUN 161 (H) 8 - 23 mg/dL   Creatinine, Ser 0.96 (H) 0.61 - 1.24 mg/dL   Calcium 8.8 (L) 8.9 - 10.3 mg/dL   Total Protein 7.1 6.5 - 8.1 g/dL   Albumin 2.7 (L) 3.5 - 5.0 g/dL   AST 49 (H) 15 - 41 U/L   ALT 30 0 - 44 U/L   Alkaline Phosphatase 83 38 - 126 U/L   Total Bilirubin 3.5 (H) 0.3 - 1.2 mg/dL   GFR, Estimated 10 (L) >60 mL/min    Anion gap 14 5 - 15  Brain natriuretic peptide     Status: None   Collection Time: 22-May-2021 10:58 AM  Result Value Ref Range   B Natriuretic Peptide 95.7 0.0 - 100.0 pg/mL  Lactic acid, plasma     Status: Abnormal   Collection Time: 2021-05-22 10:58 AM  Result Value Ref Range   Lactic Acid, Venous 3.5 (HH) 0.5 - 1.9 mmol/L  Troponin I (High Sensitivity)     Status: Abnormal   Collection Time: 22-May-2021 10:58 AM  Result Value Ref Range   Troponin I (High Sensitivity) 28 (H) <18 ng/L  CBC with Differential     Status: Abnormal   Collection Time: 22-May-2021 10:58 AM  Result Value Ref Range  WBC 9.2 4.0 - 10.5 K/uL   RBC 4.69 4.22 - 5.81 MIL/uL   Hemoglobin 14.6 13.0 - 17.0 g/dL   HCT 73.5 32.9 - 92.4 %   MCV 90.4 80.0 - 100.0 fL   MCH 31.1 26.0 - 34.0 pg   MCHC 34.4 30.0 - 36.0 g/dL   RDW 26.8 34.1 - 96.2 %   Platelets 277 150 - 400 K/uL   nRBC 0.0 0.0 - 0.2 %   Neutrophils Relative % 86 %   Neutro Abs 8.0 (H) 1.7 - 7.7 K/uL   Lymphocytes Relative 5 %   Lymphs Abs 0.4 (L) 0.7 - 4.0 K/uL   Monocytes Relative 3 %   Monocytes Absolute 0.3 0.1 - 1.0 K/uL   Eosinophils Relative 1 %   Eosinophils Absolute 0.1 0.0 - 0.5 K/uL   Basophils Relative 0 %   Basophils Absolute 0.0 0.0 - 0.1 K/uL   WBC Morphology INCREASED BANDS (>20% BANDS)    RBC Morphology MORPHOLOGY UNREMARKABLE    Smear Review MORPHOLOGY UNREMARKABLE    Immature Granulocytes 5 %   Abs Immature Granulocytes 0.41 (H) 0.00 - 0.07 K/uL  Lipase, blood     Status: None   Collection Time: 05/26/21 10:58 AM  Result Value Ref Range   Lipase 28 11 - 51 U/L  Prepare RBC (crossmatch)     Status: None (Preliminary result)   Collection Time: 2021-05-26 11:09 AM  Result Value Ref Range   Order Confirmation PENDING   Type and screen     Status: None   Collection Time: 26-May-2021 11:20 AM  Result Value Ref Range   ABO/RH(D) AB POS    Antibody Screen NEG    Sample Expiration      05/07/2021,2359 Performed at [facility],  [address] [place], [place] [address]   Lactic acid, plasma     Status: Abnormal   Collection Time: 2021-05-26 12:46 PM  Result Value Ref Range   Lactic Acid, Venous 2.0 (HH) 0.5 - 1.9 mmol/L  Troponin I (High Sensitivity)     Status: Abnormal   Collection Time: May 26, 2021 12:46 PM  Result Value Ref Range   Troponin I (High Sensitivity) 24 (H) <18 ng/L    Imaging: CT ABDOMEN PELVIS WO CONTRAST  Addendum Date: 2021-05-26   ADDENDUM REPORT: May 26, 2021 12:34 ADDENDUM: Findings were discussed by telephone with Dr. Marisa Severin at 12:30 p.m., 05-26-2021. Electronically Signed   By: Jearld Lesch M.D.   On: 26-May-2021 12:34   Result Date: 26-May-2021 CLINICAL DATA:  Abdominal pain, free air identified by plain radiograph EXAM: CT ABDOMEN AND PELVIS WITHOUT CONTRAST TECHNIQUE: Multidetector CT imaging of the abdomen and pelvis was performed following the standard protocol without IV contrast. COMPARISON:  Same-day abdominal radiographs FINDINGS: Lower chest: No acute abnormality.  Cardiomegaly. Hepatobiliary: No solid liver abnormality is seen. No gallstones, gallbladder wall thickening, or biliary dilatation. Pancreas: Unremarkable. No pancreatic ductal dilatation or surrounding inflammatory changes. Spleen: Normal in size without significant abnormality. Adrenals/Urinary Tract: Adrenal glands are unremarkable. Kidneys are normal, without renal calculi, solid lesion, or hydronephrosis. Bladder is unremarkable. Stomach/Bowel: Stomach is within normal limits. Appendix appears normal. No evidence of bowel wall thickening, distention, or inflammatory changes. There is extensive small bowel diverticulosis with inflammatory fluid and fat stranding in the central small bowel mesentery (series 2, image 46). Sigmoid diverticulosis. Vascular/Lymphatic: Aortic atherosclerosis. No enlarged abdominal or pelvic lymph nodes. Reproductive: Prostatomegaly. Other: No abdominal wall hernia or abnormality. Trace free fluid in  the small bowel  mesentery and left lower quadrant. Moderate volume pneumoperitoneum throughout the abdomen and pelvis, including in the lesser peritoneal sac. Musculoskeletal: No acute or significant osseous findings. IMPRESSION: 1. Moderate volume pneumoperitoneum throughout the abdomen and pelvis, including in the lesser peritoneal sac, consistent with bowel perforation. 2. Extensive small bowel diverticulosis with inflammatory fluid and fat stranding in the central small bowel mesentery. Suspect small bowel diverticulitis as nidus of perforation. 3. Prostatomegaly. Call report request was placed at the time of interpretation, final communication will be documented. Aortic Atherosclerosis (ICD10-I70.0). Electronically Signed: By: Jearld Lesch M.D. On: 05-11-2021 12:24   DG ABD ACUTE 2+V W 1V CHEST  Result Date: 11-May-2021 CLINICAL DATA:  Abdominal pain since Saturday. Nausea vomiting and diarrhea. History of CHF and hypertension. EXAM: DG ABDOMEN ACUTE WITH 1 VIEW CHEST COMPARISON:  06/11/2020 chest radiograph. FINDINGS: Frontal view of the chest demonstrates midline trachea. Mild cardiomegaly. No pleural effusion or pneumothorax. Low lung volumes with resultant pulmonary interstitial prominence. Scarring or subsegmental atelectasis at the lateral left lung base. Abdominal films demonstrate apparent free intraperitoneal air as evidenced by lucency under the right greater than left hemidiaphragms. No significant air-fluid levels on upright positioning of the upper abdomen. Supine views demonstrate no gaseous distention of bowel loops. Gas-filled, normal caliber small bowel loops are nonspecific. Phleboliths in the left hemipelvis. IMPRESSION: Free intraperitoneal air under the hemidiaphragms. Recommend further evaluation with CT. No bowel obstruction identified. Cardiomegaly with mild left lower lobe scarring. Critical test results telephoned to .Jacki Cones, rn. At the time of interpretation at 11:36 a.m.On  05-11-2021. Electronically Signed   By: Jeronimo Greaves M.D.   On: 11-May-2021 11:37    Assessment and Plan: This is a 76 y.o. male with pneumoperitoneum.  --Patient is hypotensive, in renal failure, tachypneic, with lactic acidosis and peritonitis.  Discussed with him the findings on his labs and CT scan and the need for emergent exploratory laparotomy to identify and correct the source of his perforation.  It is not fully clear the true source of his free air, though could be small bowel diverticulitis, and discussed with him that we would do a full exploration of stomach, small bowel, and colon.  Depending on the source, may need primary repair, resection and anastomosis, or resection and ostomy.  Discussed with him the risks of bleeding, infection, injury to surrounding structures, the need for ICU post-operatively, likely that he would remain intubated post-op, and that there could be more complications particularly with his heart such as worsening heart failure and arrest.  He understands and he wants to proceed with everything that is needed.  He has been given IV fluids, is getting started on IV levophed, and has ordered Zosyn.  Will order COVID swab now. --Have discussed with the patient's daughter Raynelle Fanning, and they have confirmed that he is Full Code. --Have discussed with Dr. Jayme Cloud with ICU team about anticipated admission to ICU post-op.  I spent 70 minutes dedicated to the care of this patient on the date of this encounter to include pre-visit review of records, face-to-face time with the patient discussing diagnosis and management, and any post-visit coordination of care.   Howie Ill, MD [facility]:  [phone]

## 2021-05-10 NOTE — Significant Event (Signed)
Patient presented to the ICU from OR.  He was intubated and mechanically ventilated.  Requiring norepinephrine and Neo-Synephrine as pressors.  Upon arrival to the ICU became progressively hypotensive and eventually bradycardic with PEA.  CODE BLUE situation ensued.  Patient had ACLS protocol initiated.  Given epinephrine and bicarbonate IV and high-quality chest compressions.  Able to regain pulse and return to prior ventilator support.  However within a few minutes patient became bradycardic and pulseless.  CODE BLUE activated again.  Please see code record.  A point-of-care echocardiogram was performed which showed that his left ventricle was almost akinetic.  LVEF calculated at less than 10%.  Right ventricle appeared dilated as well.  Patient had a third CODE BLUE situation ensue.  At this point patient's daughter presented to bedside.  Patient was on multiple pressors to include epinephrine infusion.  Patient did not respond well to these interventions.  He remained bradycardic and hypotensive despite multiple pressors.  At this point daughter confirmed DNR/DNI status.  Suspect myocardial infarction in the setting of severe sepsis due to peritonitis from small bowel obstruction.  Gailen Shelter, MD Advanced Bronchoscopy [facility]    *This note was dictated using voice recognition software/Dragon.  Despite best efforts to proofread, errors can occur which can change the meaning. Any transcriptional errors that result from this process are unintentional and may not be fully corrected at the time of dictation.

## 2021-05-10 NOTE — Progress Notes (Signed)
I responded to a page from the nurse to provide spiritual support for the patient's family. I arrived at the patient's room, where his daughter, Raynelle Fanning, was present. I shared words of encouragement and led in prayer.    05-11-21 2000  Clinical Encounter Type  Visited With Patient and family together  Visit Type Spiritual support  Referral From Nurse  Consult/Referral To Chaplain  Spiritual Encounters  Spiritual Needs Prayer;Emotional    Chaplain Dr Melvyn Novas

## 2021-05-10 NOTE — Progress Notes (Signed)
Pharmacy Antibiotic Note  Douglas Mccall is a 76 y.o. male admitted on May 26, 2021.  Pharmacy has been consulted for Zosyn dosing for intra-abdominal infection.  Plan: Zosyn 2.25g IV q8h Monitor renal function and adjust dose as clinically indicated  Height: 5\' 11"  (180.3 cm) Weight: 110.2 kg (242 lb 15.2 oz) IBW/kg (Calculated) : 75.3  Temp (24hrs), Avg:95.4 F (35.2 C), Min:94.5 F (34.7 C), Max:96.2 F (35.7 C)  Recent Labs  Lab 05/26/2021 1058 05/26/2021 1246  WBC 9.2  --   CREATININE 5.66*  --   LATICACIDVEN 3.5* 2.0*    Estimated Creatinine Clearance: 14.5 mL/min (A) (by C-G formula based on SCr of 5.66 mg/dL (H)).    No Known Allergies  Antimicrobials this admission: 12/26 Zosyn >>    Thank you for allowing pharmacy to be a part of this patients care.  Albi Rappaport O Argil Mahl May 26, 2021 3:20 PM

## 2021-05-10 NOTE — Transfer of Care (Signed)
Immediate Anesthesia Transfer of Care Note  Patient: Douglas Mccall  Procedure(s) Performed: SMALL BOWEL RESECTION (Abdomen) EXPLORATORY LAPAROTOMY (Abdomen)  Patient Location: ICU  Anesthesia Type:General  Level of Consciousness: sedated, unresponsive and Patient remains intubated per anesthesia plan  Airway & Oxygen Therapy: Patient remains intubated per anesthesia plan and Patient placed on Ventilator (see vital sign flow sheet for setting)  Post-op Assessment: Report given to RN and Post -op Vital signs reviewed and stable  Post vital signs: Reviewed and stable  Last Vitals:  Vitals Value Taken Time  BP 106/67 06-01-2021 1730  Temp    Pulse 87 06-01-2021 1736  Resp 23 06/01/21 1736  SpO2 93 % 06/01/2021 1736  Vitals shown include unvalidated device data.  Last Pain:  Vitals:   2021/06/01 1126  TempSrc: Rectal         Complications: No notable events documented.

## 2021-05-10 NOTE — Anesthesia Postprocedure Evaluation (Signed)
Anesthesia Post Note  Patient: Douglas Mccall  Procedure(s) Performed: SMALL BOWEL RESECTION (Abdomen) EXPLORATORY LAPAROTOMY (Abdomen)  Patient location during evaluation: ICU Anesthesia Type: General Level of consciousness: patient remains intubated per anesthesia plan and sedated Vital Signs Assessment: vitals unstable Respiratory status: patient remains intubated per anesthesia plan and patient on ventilator - see flowsheet for VS Cardiovascular status: unstable Anesthetic complications: no Comments: Pt transported intubated and sedated on monitors to ICU, report given.  Remains very unstable, hypotensive on two pressors, with periods of hypoxia requiring manual bag mask ventilation for recruitment.   No notable events documented.   Last Vitals:  Vitals:   05-25-2021 1850 May 25, 2021 1900  BP:  (!) 66/28  Pulse: (!) 30 76  Resp: (!) 27 (!) 27  Temp:    SpO2: (!) 68% (!) 68%    Last Pain:  Vitals:   May 25, 2021 1735  TempSrc: Oral                 Reed Breech

## 2021-05-10 NOTE — ED Notes (Signed)
Report given to Douglas Mccall in the OR

## 2021-05-10 NOTE — Progress Notes (Addendum)
VAST consult received to place USGIV for pressor administration. Patient in OR at this time.  Update: 1810 - patient returned to ICU from OR with CL in place.

## 2021-05-10 NOTE — ED Triage Notes (Signed)
Presents with family   has had some abd discomfort with n/v/d   weakness

## 2021-05-10 NOTE — ED Notes (Signed)
MD notified that radiology reported free air seen on films

## 2021-05-10 NOTE — Anesthesia Procedure Notes (Signed)
Central Venous Catheter Insertion Performed by: Reed Breech, MD, anesthesiologist Start/End12/31/22 2:38 PM, 05/09/2021 2:55 PM Patient location: OR. Preanesthetic checklist: patient identified, IV checked, risks and benefits discussed, surgical consent, monitors and equipment checked, pre-op evaluation, timeout performed and anesthesia consent Position: Trendelenburg Patient sedated Hand hygiene performed  and maximum sterile barriers used  Catheter size: 8 Fr Total catheter length 16. Central line was placed.Double lumen Procedure performed using ultrasound guided technique. Ultrasound Notes:anatomy identified, needle tip was noted to be adjacent to the nerve/plexus identified and no ultrasound evidence of intravascular and/or intraneural injection Attempts: 1 Following insertion, dressing applied, line sutured and Biopatch. Post procedure assessment: blood return through all ports, free fluid flow and no air  Patient tolerated the procedure well with no immediate complications.

## 2021-05-10 NOTE — Progress Notes (Signed)
Pharmacy Antibiotic Note  Douglas Mccall is a 76 y.o. male admitted on 05/17/2021.  Pharmacy has been consulted for Zosyn and Fluconazole dosing for intra-abdominal infection.  Plan: Zosyn 2.25g IV q8h Fluconazole 800mg  IV once followed by 200mg  IV daily Monitor renal function and adjust dose as clinically indicated  Height: 5\' 11"  (180.3 cm) Weight: 115.3 kg (254 lb 3.1 oz) IBW/kg (Calculated) : 75.3  Temp (24hrs), Avg:96.4 F (35.8 C), Min:94.5 F (34.7 C), Max:98.5 F (36.9 C)  Recent Labs  Lab May 17, 2021 1058 17-May-2021 1246 May 17, 2021 1855  WBC 9.2  --  7.6  CREATININE 5.66*  --   --   LATICACIDVEN 3.5* 2.0*  --      Estimated Creatinine Clearance: 14.8 mL/min (A) (by C-G formula based on SCr of 5.66 mg/dL (H)).    No Known Allergies  Antimicrobials this admission: 12/26 Zosyn >>  12/26 Fluconazole >>   Thank you for allowing pharmacy to be a part of this patients care.  05/06/21, PharmD, BCPS 05-17-21 7:57 PM

## 2021-05-10 NOTE — ED Notes (Signed)
Pt being transported to OR at this time

## 2021-05-10 DEATH — deceased

## 2022-06-25 IMAGING — DX DG ABDOMEN ACUTE W/ 1V CHEST
4 series · 4 of 4 positions shown · non-contrast
Comparison: 06/11/2020 chest radiograph.

CLINICAL DATA: Abdominal pain since [REDACTED]. Nausea vomiting and
diarrhea. History of CHF and hypertension.

EXAM:
DG ABDOMEN ACUTE WITH 1 VIEW CHEST

[abdomen erect]
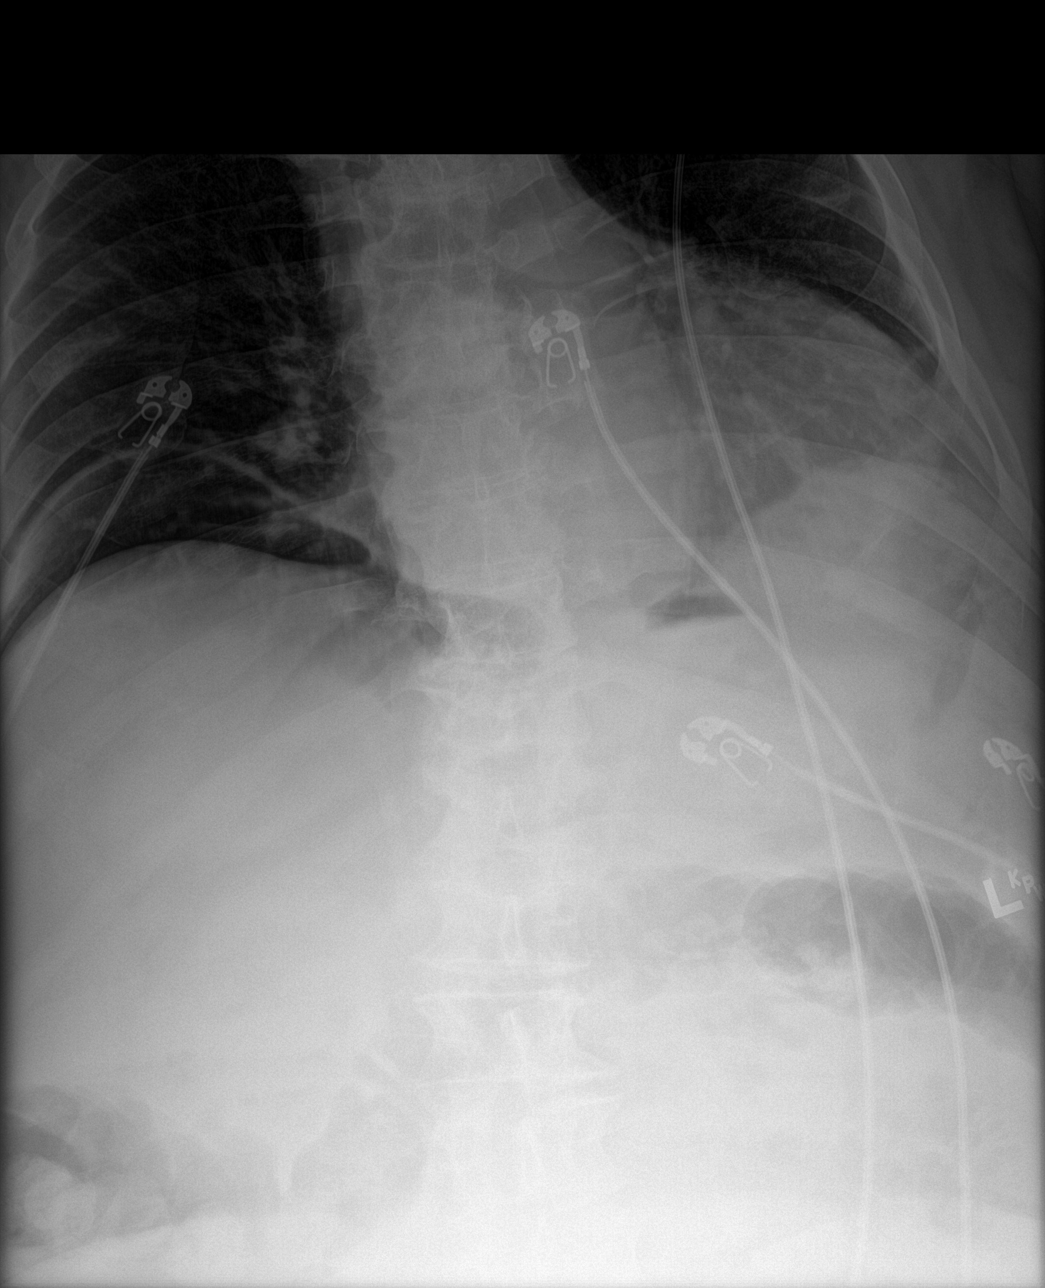

[abdomen supine (1 of 2)]
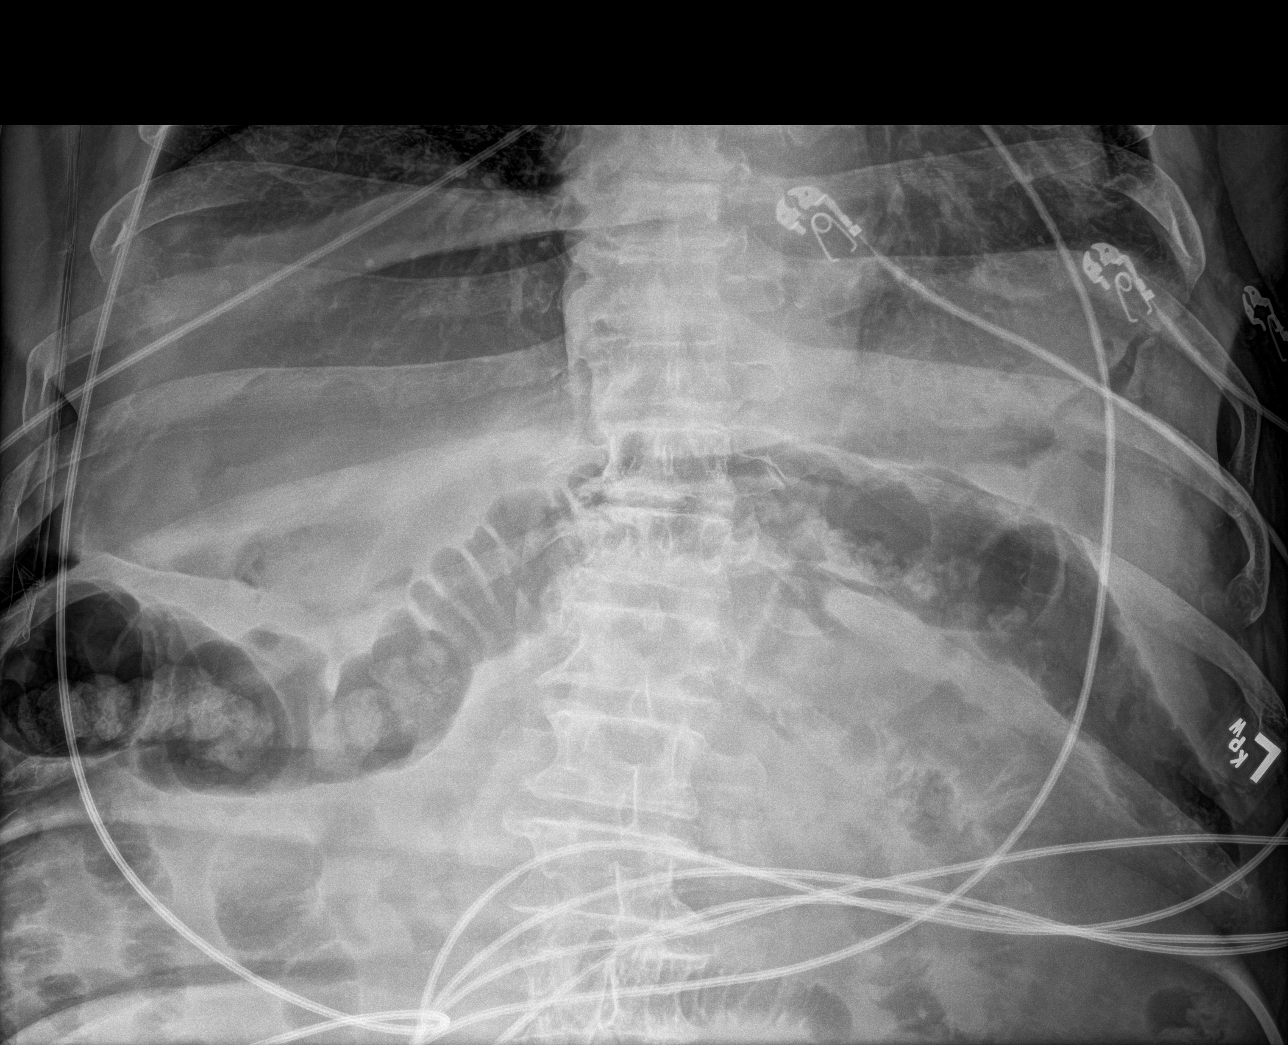

[abdomen supine (2 of 2)]
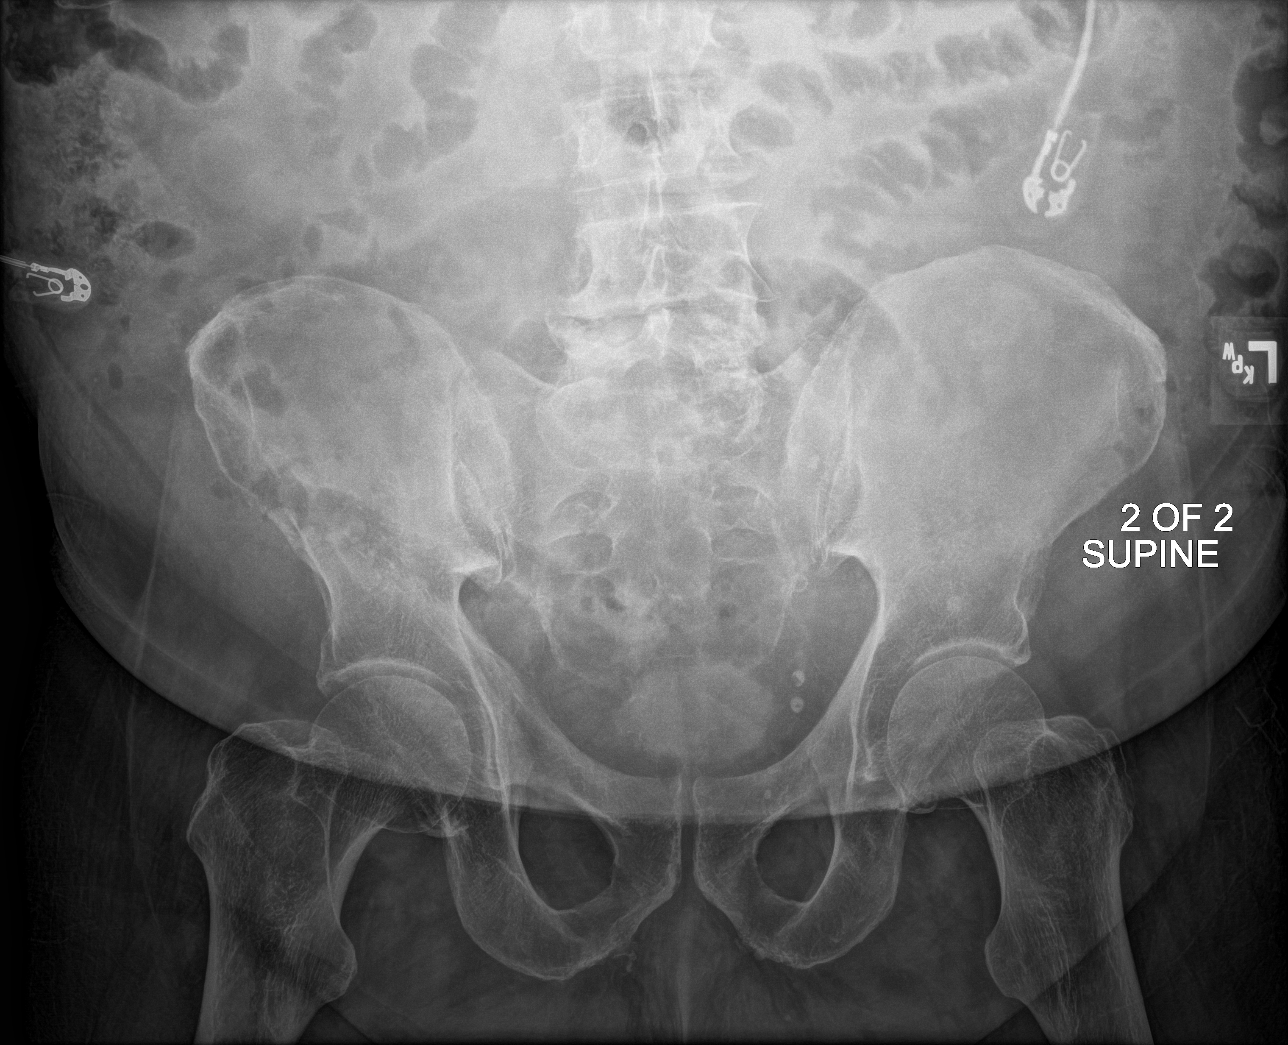

[chest ap]
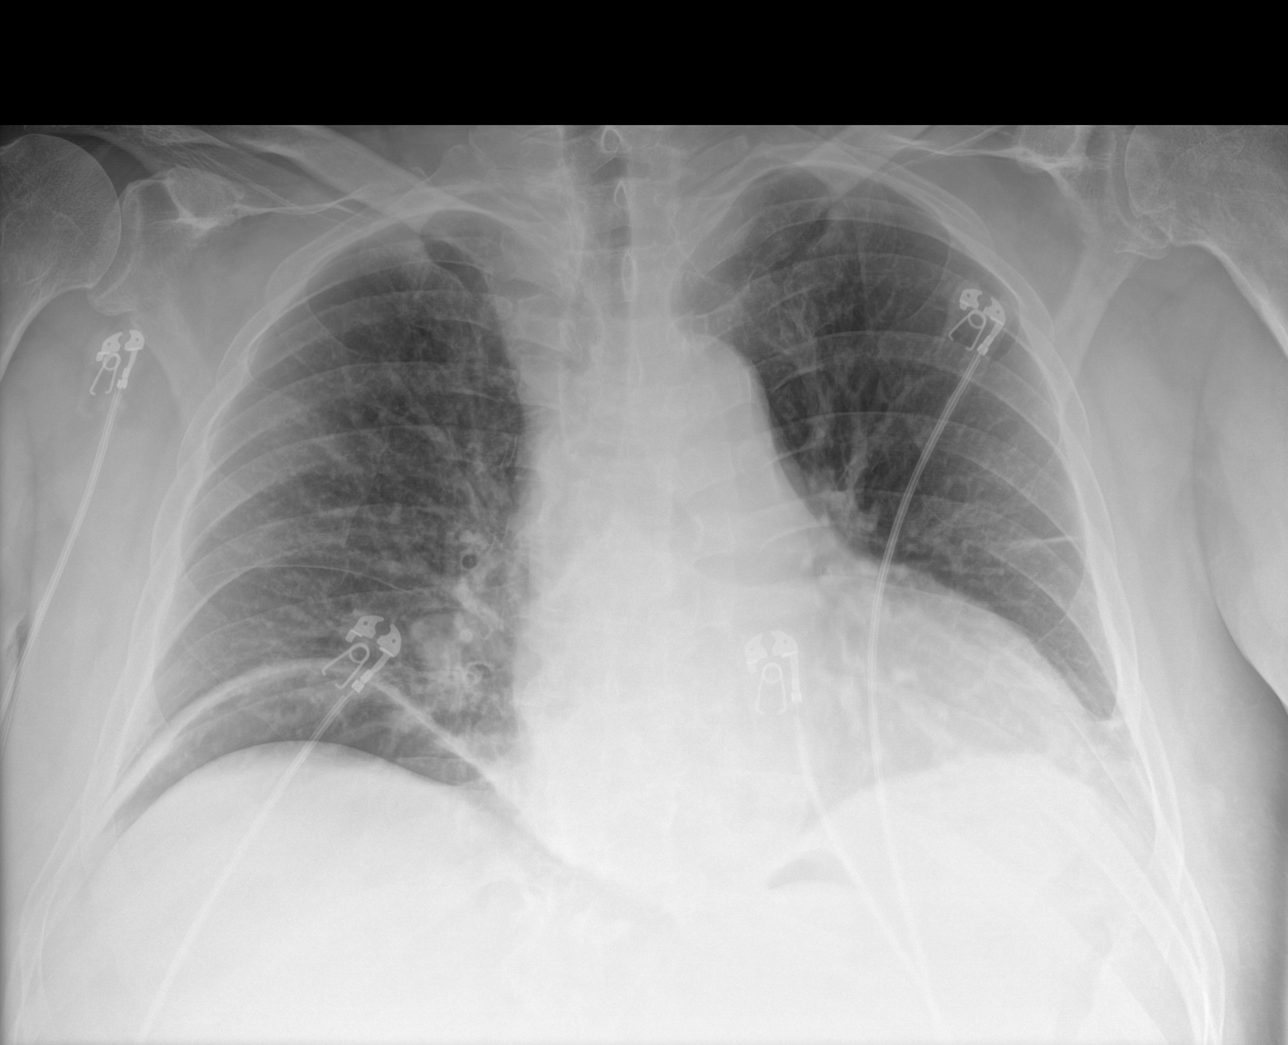

[4 of 4 positions shown; findings below may reference images not displayed]

FINDINGS: Frontal view of the chest demonstrates midline trachea. Mild
cardiomegaly. No pleural effusion or pneumothorax. Low lung volumes
with resultant pulmonary interstitial prominence. Scarring or
subsegmental atelectasis at the lateral left lung base.

Abdominal films demonstrate apparent free intraperitoneal air as
evidenced by lucency under the right greater than left
hemidiaphragms.

No significant air-fluid levels on upright positioning of the upper
abdomen. Supine views demonstrate no gaseous distention of bowel
loops. Gas-filled, normal caliber small bowel loops are nonspecific.
Phleboliths in the left hemipelvis.
IMPRESSION: Free intraperitoneal air under the hemidiaphragms. Recommend further
evaluation with CT.

No bowel obstruction identified.

Cardiomegaly with mild left lower lobe scarring.

Critical test results telephoned to .Izzfin, rn. At the time of
